# Patient Record
Sex: Male | Born: 1970 | Race: Black or African American | Hispanic: No | Marital: Married | Smoking: Former smoker
Health system: Southern US, Community
[De-identification: ages and names within clinical notes are randomized; demographics above are authoritative.]

## PROBLEM LIST (undated history)

## (undated) DIAGNOSIS — E119 Type 2 diabetes mellitus without complications: Secondary | ICD-10-CM

## (undated) DIAGNOSIS — K219 Gastro-esophageal reflux disease without esophagitis: Secondary | ICD-10-CM

## (undated) DIAGNOSIS — F319 Bipolar disorder, unspecified: Secondary | ICD-10-CM

## (undated) DIAGNOSIS — K7460 Unspecified cirrhosis of liver: Secondary | ICD-10-CM

## (undated) DIAGNOSIS — E7800 Pure hypercholesterolemia, unspecified: Secondary | ICD-10-CM

## (undated) DIAGNOSIS — J45909 Unspecified asthma, uncomplicated: Secondary | ICD-10-CM

---

## 2005-02-17 ENCOUNTER — Emergency Department (HOSPITAL_COMMUNITY): Admission: EM | Admit: 2005-02-17 | Discharge: 2005-02-18 | Payer: Self-pay | Admitting: Emergency Medicine

## 2007-06-17 ENCOUNTER — Encounter: Admission: RE | Admit: 2007-06-17 | Discharge: 2007-06-17 | Payer: Self-pay | Admitting: Gastroenterology

## 2010-06-08 ENCOUNTER — Encounter: Payer: Self-pay | Admitting: Gastroenterology

## 2014-02-17 ENCOUNTER — Encounter (HOSPITAL_COMMUNITY): Payer: Self-pay | Admitting: Emergency Medicine

## 2014-02-17 ENCOUNTER — Emergency Department (INDEPENDENT_AMBULATORY_CARE_PROVIDER_SITE_OTHER)
Admission: EM | Admit: 2014-02-17 | Discharge: 2014-02-17 | Disposition: A | Discharge status: Home / Self Care | Payer: Self-pay | Source: Home / Self Care | Attending: Family Medicine | Admitting: Family Medicine

## 2014-02-17 DIAGNOSIS — J300 Vasomotor rhinitis: Secondary | ICD-10-CM

## 2014-02-17 DIAGNOSIS — R739 Hyperglycemia, unspecified: Secondary | ICD-10-CM

## 2014-02-17 HISTORY — DX: Type 2 diabetes mellitus without complications: E11.9

## 2014-02-17 HISTORY — DX: Gastro-esophageal reflux disease without esophagitis: K21.9

## 2014-02-17 LAB — POCT I-STAT, CHEM 8
BUN: 13 mg/dL (ref 6–23)
CALCIUM ION: 1.14 mmol/L (ref 1.12–1.23)
CHLORIDE: 106 meq/L (ref 96–112)
CREATININE: 1.2 mg/dL (ref 0.50–1.35)
GLUCOSE: 125 mg/dL — AB (ref 70–99)
HCT: 52 % (ref 39.0–52.0)
Hemoglobin: 17.7 g/dL — ABNORMAL HIGH (ref 13.0–17.0)
POTASSIUM: 4.3 meq/L (ref 3.7–5.3)
Sodium: 141 mEq/L (ref 137–147)
TCO2: 23 mmol/L (ref 0–100)

## 2014-02-17 MED ORDER — IPRATROPIUM BROMIDE 0.06 % NA SOLN: 2.0000 | Freq: Four times a day (QID) | NASAL | Status: DC

## 2014-02-17 NOTE — ED Provider Notes (Signed)
Eddie Lane is a 43 y.o. male who presents to Urgent Care today for fatigue. Patient has felt ill for the last day or so. He's a little fatigue yesterday evening and noted his blood sugar was higher than normal. He measured it in the 270s. He did not go to work because he felt poorly and now needs a work note. He's feeling a bit better today and notes that his blood sugar is improved but not back to normal in the 170s. No chest pain palpitations or shortness of breath. No cough congestion or runny nose. He has not tried any medications yet.  He works in a [employer] and notes that he typically gets a runny nose at work.  Past Medical History  Diagnosis Date  . Diabetes mellitus without complication   . GERD (gastroesophageal reflux disease)    History  Substance Use Topics  . Smoking status: Current Every Day Smoker  . Smokeless tobacco: Not on file  . Alcohol Use: Yes     Comment: occasional   ROS as above Medications: No current facility-administered medications for this encounter.   Current Outpatient Prescriptions  Medication Sig Dispense Refill  . Acetaminophen (TYLENOL PO) Take by mouth.      Marland Kitchen. aspirin 81 MG tablet Take 81 mg by mouth daily.      . CYCLOBENZAPRINE HCL PO Take by mouth as needed.      . metFORMIN (GLUCOPHAGE) 500 MG tablet Take by mouth 2 (two) times daily with a meal.      . OMEPRAZOLE PO Take by mouth.      Marland Kitchen. ipratropium (ATROVENT) 0.06 % nasal spray Place 2 sprays into both nostrils 4 (four) times daily.  15 mL  12    Exam:  BP 133/86  Pulse 86  Temp(Src) 99.3 F (37.4 C) (Oral)  Resp 18  SpO2 96% Gen: Well NAD HEENT: EOMI,  MMM normal posterior pharynx. Normal tympanic membranes. Lungs: Normal work of breathing. CTABL Heart: RRR no MRG Abd: NABS, Soft. Nondistended, Nontender Exts: Brisk capillary refill, warm and well perfused.   Results for orders placed during the hospital encounter of 02/17/14 (from the past 24 hour(s))  POCT I-STAT,  CHEM 8     Status: Abnormal   Collection Time    02/17/14  1:26 PM      Result Value Ref Range   Sodium 141  137 - 147 mEq/L   Potassium 4.3  3.7 - 5.3 mEq/L   Chloride 106  96 - 112 mEq/L   BUN 13  6 - 23 mg/dL   Creatinine, Ser 8.111.20  0.50 - 1.35 mg/dL   Glucose, Bld 914125 (*) 70 - 99 mg/dL   Calcium, Ion 7.821.14  9.561.12 - 1.23 mmol/L   TCO2 23  0 - 100 mmol/L   Hemoglobin 17.7 (*) 13.0 - 17.0 g/dL   HCT 21.352.0  08.639.0 - 57.852.0 %   No results found.  Assessment and Plan: 43 y.o. male with fatigue. Possibly related to earlier hyperglycemia. Blood sugar well controlled currently. Plan for one full waiting and followup with PCP as needed. Work note provided.  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 Plan to use Atrovent nasal spray.  Discussed warning signs or symptoms. Please see discharge instructions. Patient expresses understanding.     Rodolph BongEvan S Corey, MD 02/17/14 [phone]

## 2014-02-17 NOTE — ED Notes (Addendum)
C/O weakness since yesterday.  Here because he needs a note excusing him from work yesterday.  Yesterday CBG = 270, this morning CBG = 167.  Has been eating as usual.  Denies any illnesses/cold sxs.  Denies pain.  Continues feeling weak.

## 2014-02-17 NOTE — Discharge Instructions (Signed)
Thank you for coming in today. Continue medications as directed.  Take Tylenol as needed for pain.  Go back to work when feeling better.  Use Atrovent nasal spray to prevent runny nose in cold rooms.   Blood Glucose Monitoring Monitoring your blood glucose (also know as blood sugar) helps you to manage your diabetes. It also helps you and your health care provider monitor your diabetes and determine how well your treatment plan is working. WHY SHOULD YOU MONITOR YOUR BLOOD GLUCOSE?  It can help you understand how food, exercise, and medicine affect your blood glucose.  It allows you to know what your blood glucose is at any given moment. You can quickly tell if you are having low blood glucose (hypoglycemia) or high blood glucose (hyperglycemia).  It can help you and your health care provider know how to adjust your medicines.  It can help you understand how to manage an illness or adjust medicine for exercise. WHEN SHOULD YOU TEST? Your health care provider will help you decide how often you should check your blood glucose. This may depend on the type of diabetes you have, your diabetes control, or the types of medicines you are taking. Be sure to write down all of your blood glucose readings so that this information can be reviewed with your health care provider. See below for examples of testing times that your health care provider may suggest. Type 1 Diabetes  Test 4 times a day if you are in good control, using an insulin pump, or perform multiple daily injections.  If your diabetes is not well controlled or if you are sick, you may need to monitor more often.  It is a good idea to also monitor:  Before and after exercise.  Between meals and 2 hours after a meal.  Occasionally between 2:00 a.m. and 3:00 a.m. Type 2 Diabetes  It can vary with each person, but generally, if you are on insulin, test 4 times a day.  If you take medicines by mouth (orally), test 2 times a  day.  If you are on a controlled diet, test once a day.  If your diabetes is not well controlled or if you are sick, you may need to monitor more often. HOW TO MONITOR YOUR BLOOD GLUCOSE Supplies Needed  Blood glucose meter.  Test strips for your meter. Each meter has its own strips. You must use the strips that go with your own meter.  A pricking needle (lancet).  A device that holds the lancet (lancing device).  A journal or log book to write down your results. Procedure  Wash your hands with soap and water. Alcohol is not preferred.  Prick the side of your finger (not the tip) with the lancet.  Gently milk the finger until a small drop of blood appears.  Follow the instructions that come with your meter for inserting the test strip, applying blood to the strip, and using your blood glucose meter. Other Areas to Get Blood for Testing Some meters allow you to use other areas of your body (other than your finger) to test your blood. These areas are called alternative sites. The most common alternative sites are:  The forearm.  The thigh.  The back area of the lower leg.  The palm of the hand. The blood flow in these areas is slower. Therefore, the blood glucose values you get may be delayed, and the numbers are different from what you would get from your fingers. Do not use  alternative sites if you think you are having hypoglycemia. Your reading will not be accurate. Always use a finger if you are having hypoglycemia. Also, if you cannot feel your lows (hypoglycemia unawareness), always use your fingers for your blood glucose checks. ADDITIONAL TIPS FOR GLUCOSE MONITORING  Do not reuse lancets.  Always carry your supplies with you.  All blood glucose meters have a 24-hour "hotline" number to call if you have questions or need help.  Adjust (calibrate) your blood glucose meter with a control solution after finishing a few boxes of strips. BLOOD GLUCOSE RECORD KEEPING It  is a good idea to keep a daily record or log of your blood glucose readings. Most glucose meters, if not all, keep your glucose records stored in the meter. Some meters come with the ability to download your records to your home computer. Keeping a record of your blood glucose readings is especially helpful if you are wanting to look for patterns. Make notes to go along with the blood glucose readings because you might forget what happened at that exact time. Keeping good records helps you and your health care provider to work together to achieve good diabetes management.  Document Released: 05/07/2003 Document Revised: 09/18/2013 Document Reviewed: 09/26/2012 [facility]: Eddie Lane Patient Information 2015 [place], [place]. This information is not intended to replace advice given to you by your health care provider. Make sure you discuss any questions you have with your health care provider.

## 2014-03-30 ENCOUNTER — Encounter (HOSPITAL_COMMUNITY): Payer: Self-pay | Admitting: Emergency Medicine

## 2014-03-30 ENCOUNTER — Emergency Department (HOSPITAL_COMMUNITY)
Admission: EM | Admit: 2014-03-30 | Discharge: 2014-03-30 | Disposition: A | Discharge status: Home / Self Care | Payer: Self-pay | Attending: Emergency Medicine | Admitting: Emergency Medicine

## 2014-03-30 DIAGNOSIS — Z79899 Other long term (current) drug therapy: Secondary | ICD-10-CM | POA: Insufficient documentation

## 2014-03-30 DIAGNOSIS — Z720 Tobacco use: Secondary | ICD-10-CM | POA: Insufficient documentation

## 2014-03-30 DIAGNOSIS — R0981 Nasal congestion: Secondary | ICD-10-CM | POA: Insufficient documentation

## 2014-03-30 DIAGNOSIS — R200 Anesthesia of skin: Secondary | ICD-10-CM | POA: Insufficient documentation

## 2014-03-30 DIAGNOSIS — J45909 Unspecified asthma, uncomplicated: Secondary | ICD-10-CM | POA: Insufficient documentation

## 2014-03-30 DIAGNOSIS — Z7951 Long term (current) use of inhaled steroids: Secondary | ICD-10-CM | POA: Insufficient documentation

## 2014-03-30 DIAGNOSIS — H9313 Tinnitus, bilateral: Secondary | ICD-10-CM | POA: Insufficient documentation

## 2014-03-30 DIAGNOSIS — E119 Type 2 diabetes mellitus without complications: Secondary | ICD-10-CM | POA: Insufficient documentation

## 2014-03-30 DIAGNOSIS — Z8719 Personal history of other diseases of the digestive system: Secondary | ICD-10-CM | POA: Insufficient documentation

## 2014-03-30 HISTORY — DX: Unspecified asthma, uncomplicated: J45.909

## 2014-03-30 LAB — I-STAT CHEM 8, ED
BUN: 16 mg/dL (ref 6–23)
CALCIUM ION: 1.25 mmol/L — AB (ref 1.12–1.23)
CHLORIDE: 105 meq/L (ref 96–112)
CREATININE: 1.2 mg/dL (ref 0.50–1.35)
GLUCOSE: 98 mg/dL (ref 70–99)
HCT: 47 % (ref 39.0–52.0)
Hemoglobin: 16 g/dL (ref 13.0–17.0)
Potassium: 4.3 mEq/L (ref 3.7–5.3)
Sodium: 141 mEq/L (ref 137–147)
TCO2: 24 mmol/L (ref 0–100)

## 2014-03-30 MED ORDER — FLUTICASONE PROPIONATE 50 MCG/ACT NA SUSP: 2.0000 | Freq: Every day | NASAL | Status: DC

## 2014-03-30 NOTE — Discharge Instructions (Signed)
Use flonase as directed. You may also use nasal saline. If your symptoms of numbness return, you should return to the emergency department. Paresthesia Paresthesia is an abnormal burning or prickling sensation. This sensation is generally felt in the hands, arms, legs, or feet. However, it may occur in any part of the body. It is usually not painful. The feeling may be described as:  Tingling or numbness.  "Pins and needles."  Skin crawling.  Buzzing.  Limbs "falling asleep."  Itching. Most people experience temporary (transient) paresthesia at some time in their lives. CAUSES  Paresthesia may occur when you breathe too quickly (hyperventilation). It can also occur without any apparent cause. Commonly, paresthesia occurs when pressure is placed on a nerve. The feeling quickly goes away once the pressure is removed. For some people, however, paresthesia is a long-lasting (chronic) condition caused by an underlying disorder. The underlying disorder may be:  A traumatic, direct injury to nerves. Examples include a:  Broken (fractured) neck.  Fractured skull.  A disorder affecting the brain and spinal cord (central nervous system). Examples include:  Transverse myelitis.  Encephalitis.  Transient ischemic attack.  Multiple sclerosis.  Stroke.  Tumor or blood vessel problems, such as an arteriovenous malformation pressing against the brain or spinal cord.  A condition that damages the peripheral nerves (peripheral neuropathy). Peripheral nerves are not part of the brain and spinal cord. These conditions include:  Diabetes.  Peripheral vascular disease.  Nerve entrapment syndromes, such as carpal tunnel syndrome.  Shingles.  Hypothyroidism.  Vitamin B12 deficiencies.  Alcoholism.  Heavy metal poisoning (lead, arsenic).  Rheumatoid arthritis.  Systemic lupus erythematosus. DIAGNOSIS  Your caregiver will attempt to find the underlying cause of your paresthesia.  Your caregiver may:  Take your medical history.  Perform a physical exam.  Order various lab tests.  Order imaging tests. TREATMENT  Treatment for paresthesia depends on the underlying cause. HOME CARE INSTRUCTIONS  Avoid drinking alcohol.  You may consider massage or acupuncture to help relieve your symptoms.  Keep all follow-up appointments as directed by your caregiver. SEEK IMMEDIATE MEDICAL CARE IF:   You feel weak.  You have trouble walking or moving.  You have problems with speech or vision.  You feel confused.  You cannot control your bladder or bowel movements.  You feel numbness after an injury.  You faint.  Your burning or prickling feeling gets worse when walking.  You have pain, cramps, or dizziness.  You develop a rash. MAKE SURE YOU:  Understand these instructions.  Will watch your condition.  Will get help right away if you are not doing well or get worse. Document Released: 04/24/2002 Document Revised: 07/27/2011 Document Reviewed: 01/23/2011 [facility] Patient Information 2015 [place], [place]. This information is not intended to replace advice given to you by your health care provider. Make sure you discuss any questions you have with your health care provider. Tinnitus Sounds you hear in your ears and coming from within the ear is called tinnitus. This can be a symptom of many ear disorders. It is often associated with hearing loss.  Tinnitus can be seen with:  Infections.  Ear blockages such as wax buildup.  Meniere's disease.  Ear damage.  Inherited.  Occupational causes. While irritating, it is not usually a threat to health. When the cause of the tinnitus is wax, infection in the middle ear, or foreign body it is easily treated. Hearing loss will usually be reversible.  TREATMENT  When treating the underlying cause does  not get rid of tinnitus, it may be necessary to get rid of the unwanted sound by covering it up with more  pleasant background noises. This may include music, the radio etc. There are tinnitus maskers which can be worn which produce background noise to cover up the tinnitus. Avoid all medications which tend to make tinnitus worse such as alcohol, caffeine, aspirin, and nicotine. There are many soothing background tapes such as rain, ocean, thunderstorms, etc. These soothing sounds help with sleeping or resting. Keep all follow-up appointments and referrals. This is important to identify the cause of the problem. It also helps avoid complications, impaired hearing, disability, or chronic pain. Document Released: 05/04/2005 Document Revised: 07/27/2011 Document Reviewed: 12/21/2007 [facility] Patient Information 2015 [place], [place]. This information is not intended to replace advice given to you by your health care provider. Make sure you discuss any questions you have with your health care provider. Allergic Rhinitis Allergic rhinitis is when the mucous membranes in the nose respond to allergens. Allergens are particles in the air that cause your body to have an allergic reaction. This causes you to release allergic antibodies. Through a chain of events, these eventually cause you to release histamine into the blood stream. Although meant to protect the body, it is this release of histamine that causes your discomfort, such as frequent sneezing, congestion, and an itchy, runny nose.  CAUSES  Seasonal allergic rhinitis (hay fever) is caused by pollen allergens that may come from grasses, trees, and weeds. Year-round allergic rhinitis (perennial allergic rhinitis) is caused by allergens such as house dust mites, pet dander, and mold spores.  SYMPTOMS   Nasal stuffiness (congestion).  Itchy, runny nose with sneezing and tearing of the eyes. DIAGNOSIS  Your health care provider can help you determine the allergen or allergens that trigger your symptoms. If you and your health care provider are unable to  determine the allergen, skin or blood testing may be used. TREATMENT  Allergic rhinitis does not have a cure, but it can be controlled by:  Medicines and allergy shots (immunotherapy).  Avoiding the allergen. Hay fever may often be treated with antihistamines in pill or nasal spray forms. Antihistamines block the effects of histamine. There are over-the-counter medicines that may help with nasal congestion and swelling around the eyes. Check with your health care provider before taking or giving this medicine.  If avoiding the allergen or the medicine prescribed do not work, there are many new medicines your health care provider can prescribe. Stronger medicine may be used if initial measures are ineffective. Desensitizing injections can be used if medicine and avoidance does not work. Desensitization is when a patient is given ongoing shots until the body becomes less sensitive to the allergen. Make sure you follow up with your health care provider if problems continue. HOME CARE INSTRUCTIONS It is not possible to completely avoid allergens, but you can reduce your symptoms by taking steps to limit your exposure to them. It helps to know exactly what you are allergic to so that you can avoid your specific triggers. SEEK MEDICAL CARE IF:   You have a fever.  You develop a cough that does not stop easily (persistent).  You have shortness of breath.  You start wheezing.  Symptoms interfere with normal daily activities. Document Released: 01/27/2001 Document Revised: 05/09/2013 Document Reviewed: 01/09/2013 [facility] Patient Information 2015 [place], [place]. This information is not intended to replace advice given to you by your health care provider. Make sure you discuss any  questions you have with your health care provider. ° °

## 2014-03-30 NOTE — ED Notes (Signed)
Pt. reports facial numbness , blurred vision , " ringing at ears" last week - symptoms resolved , speech clear , no facial asymmetry , alert and oriented , no arm drift/equal strong grips , " I just want to be checked" . Denies pain / respirations unlabored.

## 2014-03-30 NOTE — ED Provider Notes (Signed)
CSN: [id number]     Arrival date & time 03/30/14  1949 History   First MD Initiated Contact with Patient 03/30/14 2230     Chief Complaint  Patient presents with  . Numbness     (Consider location/radiation/quality/duration/timing/severity/associated sxs/prior Treatment) HPI Comments: This is a 43 year old male with a past medical history of diabetes, GERD and asthma who presents to the emergency department complaining of facial numbness, blurred vision and ringing in both of his ears occurring one week ago lasting about 15-20 minutes and completely subsiding on its own. Patient was not doing anything in specific when the symptoms began. States the numbness was throughout the entire front of his face. His mother states he was shaking his head to try to get the ring to go away. Over the past week his symptoms have not returned. He states he has been feeling fine. Denies ever having symptoms like this in the past. He states he feels like he has fluid behind his sinuses. Denies fever, chills, nausea, vomiting, chest pain, shortness of breath, headaches or extremity weakness. He did not call his primary care physician about his symptoms.  The history is provided by the patient and a relative.    Past Medical History  Diagnosis Date  . Diabetes mellitus without complication   . GERD (gastroesophageal reflux disease)   . Asthma    History reviewed. No pertinent past surgical history. No family history on file. History  Substance Use Topics  . Smoking status: Current Every Day Smoker  . Smokeless tobacco: Not on file  . Alcohol Use: Yes     Comment: occasional    Review of Systems  10 Systems reviewed and are negative for acute change except as noted in the HPI.  Allergies  Review of patient's allergies indicates no known allergies.  Home Medications   Prior to Admission medications   Medication Sig Start Date End Date Taking? Authorizing Provider  acetaminophen (TYLENOL) 500 MG  tablet Take 1,000 mg by mouth 2 (two) times daily.   Yes Historical Provider, MD  aspirin 81 MG tablet Take 81 mg by mouth daily.   Yes Historical Provider, MD  ipratropium (ATROVENT) 0.06 % nasal spray Place 2 sprays into both nostrils 4 (four) times daily. 02/17/14  Yes Rodolph BongEvan S Corey, MD  metFORMIN (GLUCOPHAGE) 500 MG tablet Take 500 mg by mouth 2 (two) times daily with a meal.    Yes Historical Provider, MD  fluticasone (FLONASE) 50 MCG/ACT nasal spray Place 2 sprays into both nostrils daily. 03/30/14   Ashwini Jago M Melanye Hiraldo, PA-C   BP 125/75 mmHg  Pulse 74  Temp(Src) 97.6 F (36.4 C) (Oral)  Resp 16  SpO2 98% Physical Exam  Constitutional: He is oriented to person, place, and time. He appears well-developed and well-nourished. No distress.  HENT:  Head: Normocephalic and atraumatic.  Nose: Mucosal edema present.  Mouth/Throat: Oropharynx is clear and moist.  Tympanostomy tubes bilateral TM.  Eyes: Conjunctivae and EOM are normal. Pupils are equal, round, and reactive to light.  Neck: Normal range of motion. Neck supple. No JVD present. No spinous process tenderness and no muscular tenderness present.  No meningeal signs.  Cardiovascular: Normal rate, regular rhythm, normal heart sounds and intact distal pulses.   No extremity edema.  Pulmonary/Chest: Effort normal and breath sounds normal. No respiratory distress.  Abdominal: Soft. Bowel sounds are normal. There is no tenderness.  Musculoskeletal: Normal range of motion. He exhibits no edema.  Neurological: He is alert and oriented  to person, place, and time. He has normal strength. No cranial nerve deficit or sensory deficit. He displays a negative Romberg sign. Coordination and gait normal. GCS eye subscore is 4. GCS verbal subscore is 5. GCS motor subscore is 6.  Speech fluent and goal oriented. Moves limbs without ataxia. Equal grip strength bilaterally.  Skin: Skin is warm and dry. No rash noted. He is not diaphoretic.  Psychiatric: He  has a normal mood and affect. His behavior is normal.  Nursing note and vitals reviewed.   ED Course  Procedures (including critical care time) Labs Review Labs Reviewed  I-STAT CHEM 8, ED - Abnormal; Notable for the following:    Calcium, Ion 1.25 (*)    All other components within normal limits    Imaging Review No results found.   EKG Interpretation None      MDM   Final diagnoses:  Sinus congestion  Facial numbness  Tinnitus, bilateral   Patient presenting after an episode of facial numbness, tinnitus occurring one week ago, lasting 15-20 minutes as stated above. No further symptoms other than a fluid feeling behind his sinuses. He is well-appearing and in no apparent distress. Afebrile, vital signs stable. No focal neurologic deficits. Facial numbness was bilateral, along with the tinnitus. Doubt TIA. I advised patient that if he ever has symptoms like this in the future, he should immediately come to the emergency department for evaluation. Chem-8 within normal limits. He is stable for discharge with PCP follow-up. Return precautions given. Patient states understanding of treatment care plan and is agreeable.  Discussed with attending Dr. Criss AlvineGoldston who agrees with plan of care.     Kathrynn SpeedRobyn M Laurence Folz, PA-C 03/30/14 [id number]  Audree CamelScott T Goldston, MD 03/31/14 [phone]

## 2014-04-23 ENCOUNTER — Emergency Department (INDEPENDENT_AMBULATORY_CARE_PROVIDER_SITE_OTHER)
Admission: EM | Admit: 2014-04-23 | Discharge: 2014-04-23 | Disposition: A | Discharge status: Home / Self Care | Payer: Self-pay | Source: Home / Self Care | Attending: Family Medicine | Admitting: Family Medicine

## 2014-04-23 ENCOUNTER — Encounter (HOSPITAL_COMMUNITY): Payer: Self-pay | Admitting: *Deleted

## 2014-04-23 DIAGNOSIS — A084 Viral intestinal infection, unspecified: Secondary | ICD-10-CM

## 2014-04-23 NOTE — Discharge Instructions (Signed)
Thank you for coming in today. Use over the counter imodium as needed. Do not use if you have severe abdominal pain or blood in the stool.  If your belly pain worsens, or you have high fever, bad vomiting, blood in your stool or black tarry stool go to the Emergency Room.   Diarrhea Diarrhea is frequent loose and watery bowel movements. It can cause you to feel weak and dehydrated. Dehydration can cause you to become tired and thirsty, have a dry mouth, and have decreased urination that often is dark yellow. Diarrhea is a sign of another problem, most often an infection that will not last long. In most cases, diarrhea typically lasts 2-3 days. However, it can last longer if it is a sign of something more serious. It is important to treat your diarrhea as directed by your caregiver to lessen or prevent future episodes of diarrhea. CAUSES  Some common causes include:  Gastrointestinal infections caused by viruses, bacteria, or parasites.  Food poisoning or food allergies.  Certain medicines, such as antibiotics, chemotherapy, and laxatives.  Artificial sweeteners and fructose.  Digestive disorders. HOME CARE INSTRUCTIONS  Ensure adequate fluid intake (hydration): Have 1 cup (8 oz) of fluid for each diarrhea episode. Avoid fluids that contain simple sugars or sports drinks, fruit juices, whole milk products, and sodas. Your urine should be clear or pale yellow if you are drinking enough fluids. Hydrate with an oral rehydration solution that you can purchase at pharmacies, retail stores, and online. You can prepare an oral rehydration solution at home by mixing the following ingredients together:   - tsp table salt.   tsp baking soda.   tsp salt substitute containing potassium chloride.  1  tablespoons sugar.  1 L (34 oz) of water.  Certain foods and beverages may increase the speed at which food moves through the gastrointestinal (GI) tract. These foods and beverages should be avoided  and include:  Caffeinated and alcoholic beverages.  High-fiber foods, such as raw fruits and vegetables, nuts, seeds, and whole grain breads and cereals.  Foods and beverages sweetened with sugar alcohols, such as xylitol, sorbitol, and mannitol.  Some foods may be well tolerated and may help thicken stool including:  Starchy foods, such as rice, toast, pasta, low-sugar cereal, oatmeal, grits, baked potatoes, crackers, and bagels.  Bananas.  Applesauce.  Add probiotic-rich foods to help increase healthy bacteria in the GI tract, such as yogurt and fermented milk products.  Wash your hands well after each diarrhea episode.  Only take over-the-counter or prescription medicines as directed by your caregiver.  Take a warm bath to relieve any burning or pain from frequent diarrhea episodes. SEEK IMMEDIATE MEDICAL CARE IF:   You are unable to keep fluids down.  You have persistent vomiting.  You have blood in your stool, or your stools are black and tarry.  You do not urinate in 6-8 hours, or there is only a small amount of very dark urine.  You have abdominal pain that increases or localizes.  You have weakness, dizziness, confusion, or light-headedness.  You have a severe headache.  Your diarrhea gets worse or does not get better.  You have a fever or persistent symptoms for more than 2-3 days.  You have a fever and your symptoms suddenly get worse. MAKE SURE YOU:   Understand these instructions.  Will watch your condition.  Will get help right away if you are not doing well or get worse. Document Released: 04/24/2002 Document Revised: 09/18/2013  Document Reviewed: 01/10/2012 [facility] Patient Information 2015 [place], [place]. This information is not intended to replace advice given to you by your health care provider. Make sure you discuss any questions you have with your health care provider.

## 2014-04-23 NOTE — ED Provider Notes (Signed)
Eddie Lane is a 43 y.o. male who presents to Urgent Care today for diarrhea. Patient has a one-day history of diarrhea. Diarrhea started yesterday and is currently improving. No pain or blood. No fevers or chills vomiting. Patient feels well otherwise. No sick contacts.   Past Medical History  Diagnosis Date  . Diabetes mellitus without complication   . GERD (gastroesophageal reflux disease)   . Asthma    No past surgical history on file. History  Substance Use Topics  . Smoking status: Current Every Day Smoker  . Smokeless tobacco: Not on file  . Alcohol Use: Yes     Comment: occasional   ROS as above Medications: No current facility-administered medications for this encounter.   Current Outpatient Prescriptions  Medication Sig Dispense Refill  . acetaminophen (TYLENOL) 500 MG tablet Take 1,000 mg by mouth 2 (two) times daily.    Marland Kitchen. aspirin 81 MG tablet Take 81 mg by mouth daily.    . cyclobenzaprine (FLEXERIL) 10 MG tablet Take 10 mg by mouth 3 (three) times daily as needed for muscle spasms.    . fluticasone (FLONASE) 50 MCG/ACT nasal spray Place 2 sprays into both nostrils daily. 16 g 0  . metFORMIN (GLUCOPHAGE) 500 MG tablet Take 500 mg by mouth 2 (two) times daily with a meal.     . albuterol (PROVENTIL HFA;VENTOLIN HFA) 108 (90 BASE) MCG/ACT inhaler Inhale 2 puffs into the lungs every 6 (six) hours as needed for wheezing or shortness of breath.    Marland Kitchen. ipratropium (ATROVENT) 0.06 % nasal spray Place 2 sprays into both nostrils 4 (four) times daily. 15 mL 12   No Known Allergies   Exam:  BP 133/95 mmHg  Pulse 84  Temp(Src) 98.2 F (36.8 C) (Oral)  Resp 18  SpO2 98% Gen: Well NAD morbidly obese HEENT: EOMI,  MMM Lungs: Normal work of breathing. CTABL Heart: RRR no MRG Abd: NABS, Soft. Nondistended, Nontender no rebound or guarding Exts: Brisk capillary refill, warm and well perfused.   No results found for this or any previous visit (from the past 24 hour(s)). No  results found.  Assessment and Plan: 43 y.o. male with diarrhea likely viral. Imodium. Watchful waiting. Work note provided. Follow-up as needed.  Discussed warning signs or symptoms. Please see discharge instructions. Patient expresses understanding.     Rodolph BongEvan S Murvin Gift, MD 04/23/14 [phone]

## 2014-04-23 NOTE — ED Notes (Signed)
C/o diarrhea onset last night.  No N or V.  No sick contacts.  No fever.  D x 4 last night and 4-5 times today.  He had a little blood in stool -hemorrhoid is irritated.

## 2014-05-29 ENCOUNTER — Emergency Department (INDEPENDENT_AMBULATORY_CARE_PROVIDER_SITE_OTHER)
Admission: EM | Admit: 2014-05-29 | Discharge: 2014-05-29 | Disposition: A | Discharge status: Home / Self Care | Payer: Self-pay | Source: Home / Self Care | Attending: Emergency Medicine | Admitting: Emergency Medicine

## 2014-05-29 ENCOUNTER — Encounter (HOSPITAL_COMMUNITY): Payer: Self-pay | Admitting: *Deleted

## 2014-05-29 DIAGNOSIS — J069 Acute upper respiratory infection, unspecified: Secondary | ICD-10-CM

## 2014-05-29 HISTORY — DX: Pure hypercholesterolemia, unspecified: E78.00

## 2014-05-29 MED ORDER — ALBUTEROL SULFATE HFA 108 (90 BASE) MCG/ACT IN AERS: 1.0000 | INHALATION_SPRAY | Freq: Four times a day (QID) | RESPIRATORY_TRACT | Status: DC | PRN

## 2014-05-29 MED ORDER — HYDROCOD POLST-CHLORPHEN POLST 10-8 MG/5ML PO LQCR: 5.0000 mL | Freq: Two times a day (BID) | ORAL | Status: DC | PRN

## 2014-05-29 MED ORDER — PREDNISONE 20 MG PO TABS: 20.0000 mg | ORAL_TABLET | Freq: Two times a day (BID) | ORAL | Status: DC

## 2014-05-29 NOTE — ED Notes (Signed)
C/o dry cough x 3-4 days and L ear was stopped.  Throat hurts when he coughs but did not have a sore throat.  Also c/o feeling week.  No fever.  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

## 2014-05-29 NOTE — Discharge Instructions (Signed)

## 2014-05-29 NOTE — ED Provider Notes (Signed)
Chief Complaint   Cough   History of Present Illness   Eddie Lane is a 44 year old male who's had a 3 to four-day history of nasal congestion, clear rhinorrhea, headache, and left ear congestion. He's also had dry cough, sore throat, and felt tired and rundown. He denies any wheezing, difficulty breathing, or chest pain. He does have a history of asthma and has an albuterol inhaler which she uses as needed. He denies any fever, chills, sweats, or GI symptoms. He's had no known sick exposures. 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  Review of Systems   Other than as noted above, the patient denies any of the following symptoms: Systemic:  No fevers, chills, sweats, or myalgias. Eye:  No redness or discharge. ENT:  No ear pain, headache, nasal congestion, drainage, sinus pressure, or sore throat. Neck:  No neck pain, stiffness, or swollen glands. Lungs:  No cough, sputum production, hemoptysis, wheezing, chest tightness, shortness of breath or chest pain. GI:  No abdominal pain, nausea, vomiting or diarrhea.  PMFSH   Past medical history, family history, social history, meds, and allergies were reviewed. He has diabetes and is on metformin. He also has hypercholesterolemia and is supposed be taking something for that, but he is not. He also has asthma and allergies. He uses albuterol, Atrovent, and Flonase.  Physical exam   Vital signs:  BP 114/72 mmHg  Pulse 104  Temp(Src) 97.7 F (36.5 C) (Oral)  Resp 18  SpO2 98% General:  Alert and oriented.  In no distress.  Skin warm and dry. Eye:  No conjunctival injection or drainage. Lids were normal. ENT:  He has ventilation tubes in both TMs. The left ventilation tube was occluded with cerumen, and there was no erythema or discharge in the canals.  Nasal mucosa was clear and uncongested, without drainage.  Mucous membranes were moist.  Pharynx was clear with no exudate or drainage.   There were no oral ulcerations or lesions. Neck:  Supple, no adenopathy, tenderness or mass. Lungs:  No respiratory distress.  Lungs were clear to auscultation, without wheezes, rales or rhonchi.  Breath sounds were clear and equal bilaterally.  Heart:  Regular rhythm, without gallops, murmers or rubs. Skin:  Clear, warm, and dry, without rash or lesions.  Assessment     The encounter diagnosis was Viral URI.  There is no evidence of pneumonia, strep throat, sinusitis, otitis media.    Plan    1.  Meds:  The following meds were prescribed:   New Prescriptions   ALBUTEROL (PROVENTIL HFA;VENTOLIN HFA) 108 (90 BASE) MCG/ACT INHALER    Inhale 1-2 puffs into the lungs every 6 (six) hours as needed for wheezing or shortness of breath.   CHLORPHENIRAMINE-HYDROCODONE (TUSSIONEX) 10-8 MG/5ML LQCR    Take 5 mLs by mouth every 12 (twelve) hours as needed for cough.   PREDNISONE (DELTASONE) 20 MG TABLET    Take 1 tablet (20 mg total) by mouth 2 (two) times daily.    2.  Patient Education/Counseling:  The patient was given appropriate handouts, self care instructions, and instructed in symptomatic relief.  Instructed to get extra fluids and extra rest.  He is to get the prescription for the prednisone filled if he has any trouble with his asthma.  3.  Follow up:  The patient was told to follow up here if no better in 3 to 4 days, or sooner if becoming  worse in any way, and given some red flag symptoms such as increasing fever, difficulty breathing, chest pain, or persistent vomiting which would prompt immediate return.       Reuben Likesavid C Jaleah Lefevre, MD 05/29/14 2033

## 2014-06-25 ENCOUNTER — Encounter (HOSPITAL_COMMUNITY): Payer: Self-pay | Admitting: Emergency Medicine

## 2014-06-25 ENCOUNTER — Emergency Department (INDEPENDENT_AMBULATORY_CARE_PROVIDER_SITE_OTHER)
Admission: EM | Admit: 2014-06-25 | Discharge: 2014-06-25 | Disposition: A | Discharge status: Home / Self Care | Payer: No Typology Code available for payment source | Source: Home / Self Care | Attending: Family Medicine | Admitting: Family Medicine

## 2014-06-25 DIAGNOSIS — R110 Nausea: Secondary | ICD-10-CM

## 2014-06-25 DIAGNOSIS — R05 Cough: Secondary | ICD-10-CM

## 2014-06-25 DIAGNOSIS — R059 Cough, unspecified: Secondary | ICD-10-CM

## 2014-06-25 DIAGNOSIS — R197 Diarrhea, unspecified: Secondary | ICD-10-CM

## 2014-06-25 MED ORDER — ONDANSETRON 8 MG PO TBDP: 8.0000 mg | ORAL_TABLET | Freq: Three times a day (TID) | ORAL | Status: DC | PRN

## 2014-06-25 NOTE — Discharge Instructions (Signed)
Diarrhea °Diarrhea is frequent loose and watery bowel movements. It can cause you to feel weak and dehydrated. Dehydration can cause you to become tired and thirsty, have a dry mouth, and have decreased urination that often is dark yellow. Diarrhea is a sign of another problem, most often an infection that will not last long. In most cases, diarrhea typically lasts 2-3 days. However, it can last longer if it is a sign of something more serious. It is important to treat your diarrhea as directed by your caregiver to lessen or prevent future episodes of diarrhea. °CAUSES  °Some common causes include: °· Gastrointestinal infections caused by viruses, bacteria, or parasites. °· Food poisoning or food allergies. °· Certain medicines, such as antibiotics, chemotherapy, and laxatives. °· Artificial sweeteners and fructose. °· Digestive disorders. °HOME CARE INSTRUCTIONS °· Ensure adequate fluid intake (hydration): Have 1 cup (8 oz) of fluid for each diarrhea episode. Avoid fluids that contain simple sugars or sports drinks, fruit juices, whole milk products, and sodas. Your urine should be clear or pale yellow if you are drinking enough fluids. Hydrate with an oral rehydration solution that you can purchase at pharmacies, retail stores, and online. You can prepare an oral rehydration solution at home by mixing the following ingredients together: °·  - tsp table salt. °· ¾ tsp baking soda. °·  tsp salt substitute containing potassium chloride. °· 1  tablespoons sugar. °· 1 L (34 oz) of water. °· Certain foods and beverages may increase the speed at which food moves through the gastrointestinal (GI) tract. These foods and beverages should be avoided and include: °· Caffeinated and alcoholic beverages. °· High-fiber foods, such as raw fruits and vegetables, nuts, seeds, and whole grain breads and cereals. °· Foods and beverages sweetened with sugar alcohols, such as xylitol, sorbitol, and mannitol. °· Some foods may be well  tolerated and may help thicken stool including: °· Starchy foods, such as rice, toast, pasta, low-sugar cereal, oatmeal, grits, baked potatoes, crackers, and bagels. °· Bananas. °· Applesauce. °· Add probiotic-rich foods to help increase healthy bacteria in the GI tract, such as yogurt and fermented milk products. °· Wash your hands well after each diarrhea episode. °· Only take over-the-counter or prescription medicines as directed by your caregiver. °· Take a warm bath to relieve any burning or pain from frequent diarrhea episodes. °SEEK IMMEDIATE MEDICAL CARE IF:  °· You are unable to keep fluids down. °· You have persistent vomiting. °· You have blood in your stool, or your stools are black and tarry. °· You do not urinate in 6-8 hours, or there is only a small amount of very dark urine. °· You have abdominal pain that increases or localizes. °· You have weakness, dizziness, confusion, or light-headedness. °· You have a severe headache. °· Your diarrhea gets worse or does not get better. °· You have a fever or persistent symptoms for more than 2-3 days. °· You have a fever and your symptoms suddenly get worse. °MAKE SURE YOU:  °· Understand these instructions. °· Will watch your condition. °· Will get help right away if you are not doing well or get worse. °Document Released: 04/24/2002 Document Revised: 09/18/2013 Document Reviewed: 01/10/2012 °[employer]® Patient Information ©2015 [employer]. This information is not intended to replace advice given to you by your health care provider. Make sure you discuss any questions you have with your health care provider. ° °Nausea, Adult °Nausea is the feeling that you have an upset stomach or have to vomit.   Nausea by itself is not likely a serious concern, but it may be an early sign of more serious medical problems. As nausea gets worse, it can lead to vomiting. If vomiting develops, there is the risk of dehydration.  CAUSES   Viral infections.  Food  poisoning.  Medicines.  Pregnancy.  Motion sickness.  Migraine headaches.  Emotional distress.  Severe pain from any source.  Alcohol intoxication. HOME CARE INSTRUCTIONS  Get plenty of rest.  Ask your caregiver about specific rehydration instructions.  Eat small amounts of food and sip liquids more often.  Take all medicines as told by your caregiver. SEEK MEDICAL CARE IF:  You have not improved after 2 days, or you get worse.  You have a headache. SEEK IMMEDIATE MEDICAL CARE IF:   You have a fever.  You faint.  You keep vomiting or have blood in your vomit.  You are extremely weak or dehydrated.  You have dark or bloody stools.  You have severe chest or abdominal pain. MAKE SURE YOU:  Understand these instructions.  Will watch your condition.  Will get help right away if you are not doing well or get worse. Document Released: 06/11/2004 Document Revised: 01/27/2012 Document Reviewed: 01/14/2011 [facility] Patient Information 2015 [employer] [place]. This information is not intended to replace advice given to you by your health care provider. Make sure you discuss any questions you have with your health care provider.

## 2014-06-25 NOTE — ED Provider Notes (Signed)
CSN: [id number]     Arrival date & time 06/25/14  1643 History   First MD Initiated Contact with Patient 06/25/14 1828     No chief complaint on file.  (Consider location/radiation/quality/duration/timing/severity/associated sxs/prior Treatment) HPI         44 year old male presents complaining of nausea and diarrhea. He thinks he ate some bad chicken wings and that has upset his stomach. He is here requesting a work note because he does not feel well enough to go to work today. No abdominal pain. Also has a slight cough, no chest pain or shortness of breath. No abdominal pain. Diarrhea is nonbloody. Nausea but no vomiting. He is taking Pepto-Bismol as needed with relief of his symptoms  Past Medical History  Diagnosis Date  . Diabetes mellitus without complication   . GERD (gastroesophageal reflux disease)   . Asthma   . High cholesterol    No past surgical history on file. Family History  Problem Relation Age of Onset  . Hypertension Mother   . Diabetes Mother    History  Substance Use Topics  . Smoking status: Current Every Day Smoker -- 0.25 packs/day    Types: Cigarettes  . Smokeless tobacco: Not on file  . Alcohol Use: Yes     Comment: occasional    Review of Systems  Respiratory: Positive for cough. Negative for shortness of breath.   Cardiovascular: Negative for chest pain.  Gastrointestinal: Positive for nausea and diarrhea. Negative for vomiting and abdominal pain.  All other systems reviewed and are negative.   Allergies  Review of patient's allergies indicates no known allergies.  Home Medications   Prior to Admission medications   Medication Sig Start Date End Date Taking? Authorizing Provider  acetaminophen (TYLENOL) 500 MG tablet Take 1,000 mg by mouth 2 (two) times daily.    Historical Provider, MD  albuterol (PROVENTIL HFA;VENTOLIN HFA) 108 (90 BASE) MCG/ACT inhaler Inhale 2 puffs into the lungs every 6 (six) hours as needed for wheezing or shortness of  breath.    Historical Provider, MD  albuterol (PROVENTIL HFA;VENTOLIN HFA) 108 (90 BASE) MCG/ACT inhaler Inhale 1-2 puffs into the lungs every 6 (six) hours as needed for wheezing or shortness of breath. 05/29/14   Reuben Likesavid C Keller, MD  aspirin 81 MG tablet Take 81 mg by mouth daily.    Historical Provider, MD  chlorpheniramine-HYDROcodone (TUSSIONEX) 10-8 MG/5ML LQCR Take 5 mLs by mouth every 12 (twelve) hours as needed for cough. 05/29/14   Reuben Likesavid C Keller, MD  cyclobenzaprine (FLEXERIL) 10 MG tablet Take 10 mg by mouth 3 (three) times daily as needed for muscle spasms.    Historical Provider, MD  fluticasone (FLONASE) 50 MCG/ACT nasal spray Place 2 sprays into both nostrils daily. 03/30/14   Robyn M Hess, PA-C  ipratropium (ATROVENT) 0.06 % nasal spray Place 2 sprays into both nostrils 4 (four) times daily. 02/17/14   Rodolph BongEvan S Corey, MD  metFORMIN (GLUCOPHAGE) 500 MG tablet Take 500 mg by mouth daily with breakfast.     Historical Provider, MD  ondansetron (ZOFRAN ODT) 8 MG disintegrating tablet Take 1 tablet (8 mg total) by mouth every 8 (eight) hours as needed for nausea or vomiting. 06/25/14   Graylon GoodZachary H Jeoffrey Eleazer, PA-C  predniSONE (DELTASONE) 20 MG tablet Take 1 tablet (20 mg total) by mouth 2 (two) times daily. 05/29/14   Reuben Likesavid C Keller, MD   BP 122/78 mmHg  Pulse 92  Temp(Src) 97.2 F (36.2 C) (Oral)  Resp 20  SpO2 97% Physical Exam  Constitutional: He is oriented to person, place, and time. He appears well-developed and well-nourished. No distress.  Obese Habitus  HENT:  Head: Normocephalic.  Cardiovascular: Normal rate, regular rhythm and normal heart sounds.   Pulmonary/Chest: Effort normal and breath sounds normal. No respiratory distress. He has no wheezes. He has no rales.  Abdominal: Soft. There is tenderness. There is positive Murphy's sign (equivocally positive). There is no rebound and no guarding.  Neurological: He is alert and oriented to person, place, and time. Coordination normal.   Skin: Skin is warm and dry. No rash noted. He is not diaphoretic.  Psychiatric: He has a normal mood and affect. Judgment normal.  Nursing note and vitals reviewed.   ED Course  Procedures (including critical care time) Labs Review Labs Reviewed - No data to display  Imaging Review No results found.   MDM   1. Nausea   2. Diarrhea   3. Cough    Probable enteritis, however he was given strict return precautions given the equivocally positive Murphy's sign. If his abdominal pain, nausea give worse or if he develops a fever, he will go to the emergency department for further evaluation. Clear liquid diet, Zofran, Imodium for now.   Meds ordered this encounter  Medications  . ondansetron (ZOFRAN ODT) 8 MG disintegrating tablet    Sig: Take 1 tablet (8 mg total) by mouth every 8 (eight) hours as needed for nausea or vomiting.    Dispense:  12 tablet    Refill:  0       Graylon Good, PA-C 06/25/14 [phone]

## 2014-06-29 ENCOUNTER — Encounter (HOSPITAL_COMMUNITY): Payer: Self-pay | Admitting: Emergency Medicine

## 2014-06-29 ENCOUNTER — Emergency Department (INDEPENDENT_AMBULATORY_CARE_PROVIDER_SITE_OTHER)
Admission: EM | Admit: 2014-06-29 | Discharge: 2014-06-29 | Disposition: A | Discharge status: Home / Self Care | Payer: No Typology Code available for payment source | Source: Home / Self Care

## 2014-06-29 DIAGNOSIS — A084 Viral intestinal infection, unspecified: Secondary | ICD-10-CM

## 2014-06-29 NOTE — ED Provider Notes (Signed)
CSN: [id number]     Arrival date & time 06/29/14  1943 History   None    Chief Complaint  Patient presents with  . Diarrhea   (Consider location/radiation/quality/duration/timing/severity/associated sxs/prior Treatment) HPI    Previous symptoms of nausea and diarrhea resolved on 06/27/14. Upset stomach and diarrhea started again today after eating cheescake from [employer]. Unable to go to work today due to symptoms. Has zofran at home which helped w/ previous symptoms but is not taken today. Pepto-Bismol without much relief. Denies abdominal pain, dysuria, frequency, back pain, chest pain, shortness of breath, palpitations, emesis. Diarrhea is nonbloody and loose. 2-4 episodes over the last few hours. Patient feels well otherwise and is able to stay well-hydrated. Symptoms are intermittent.     Past Medical History  Diagnosis Date  . Diabetes mellitus without complication   . GERD (gastroesophageal reflux disease)   . Asthma   . High cholesterol    History reviewed. No pertinent past surgical history. Family History  Problem Relation Age of Onset  . Hypertension Mother   . Diabetes Mother    History  Substance Use Topics  . Smoking status: Current Every Day Smoker -- 0.25 packs/day    Types: Cigarettes  . Smokeless tobacco: Not on file  . Alcohol Use: Yes     Comment: occasional    Review of Systems Per HPI with all other pertinent systems negative.   Allergies  Review of patient's allergies indicates no known allergies.  Home Medications   Prior to Admission medications   Medication Sig Start Date End Date Taking? Authorizing Provider  metFORMIN (GLUCOPHAGE) 500 MG tablet Take 500 mg by mouth daily with breakfast.    Yes Historical Provider, MD  acetaminophen (TYLENOL) 500 MG tablet Take 1,000 mg by mouth 2 (two) times daily.    Historical Provider, MD  albuterol (PROVENTIL HFA;VENTOLIN HFA) 108 (90 BASE) MCG/ACT inhaler Inhale 2 puffs into the lungs every 6 (six)  hours as needed for wheezing or shortness of breath.    Historical Provider, MD  albuterol (PROVENTIL HFA;VENTOLIN HFA) 108 (90 BASE) MCG/ACT inhaler Inhale 1-2 puffs into the lungs every 6 (six) hours as needed for wheezing or shortness of breath. 05/29/14   Reuben Likes, MD  aspirin 81 MG tablet Take 81 mg by mouth daily.    Historical Provider, MD  chlorpheniramine-HYDROcodone (TUSSIONEX) 10-8 MG/5ML LQCR Take 5 mLs by mouth every 12 (twelve) hours as needed for cough. 05/29/14   Reuben Likes, MD  cyclobenzaprine (FLEXERIL) 10 MG tablet Take 10 mg by mouth 3 (three) times daily as needed for muscle spasms.    Historical Provider, MD  fluticasone (FLONASE) 50 MCG/ACT nasal spray Place 2 sprays into both nostrils daily. 03/30/14   Robyn M Hess, PA-C  ipratropium (ATROVENT) 0.06 % nasal spray Place 2 sprays into both nostrils 4 (four) times daily. 02/17/14   Rodolph Bong, MD  ondansetron (ZOFRAN ODT) 8 MG disintegrating tablet Take 1 tablet (8 mg total) by mouth every 8 (eight) hours as needed for nausea or vomiting. 06/25/14   Graylon Good, PA-C  predniSONE (DELTASONE) 20 MG tablet Take 1 tablet (20 mg total) by mouth 2 (two) times daily. 05/29/14   Reuben Likes, MD   BP 132/94 mmHg  Pulse 110  Temp(Src) 98 F (36.7 C) (Oral)  Resp 20  SpO2 96% Physical Exam  Constitutional: He is oriented to person, place, and time. He appears well-developed and well-nourished. No distress.  HENT:  Head: Normocephalic and atraumatic.  Eyes: Pupils are equal, round, and reactive to light.  Neck: Normal range of motion.  Cardiovascular: Normal rate, normal heart sounds and intact distal pulses.   No murmur heard. Pulmonary/Chest: Effort normal and breath sounds normal.  Abdominal: Soft. Bowel sounds are normal. He exhibits no distension and no mass. There is no tenderness. There is no rebound and no guarding.  Musculoskeletal: Normal range of motion.  Neurological: He is oriented to person, place, and  time.  Skin: Skin is warm. He is not diaphoretic.  Psychiatric: He has a normal mood and affect. Judgment and thought content normal.    ED Course  Procedures (including critical care time) Labs Review Labs Reviewed - No data to display  Imaging Review No results found.   MDM   1. Viral gastroenteritis    Likely viral gastroenteritis versus food poisoning versus food allergy. Patient continue with Zofran, reassurance given. Continue with fluids. Copious handwashing. No overt sign of appendicitis, diverticulitis, pancreatitis, cholecystitis or significant bacterial infection.   Precautions given and all questions answered  Shelly Flattenavid Merrell, MD Family Medicine 06/29/2014, 8:33 PM     Ozella Rocksavid J Merrell, MD 06/29/14 2033

## 2014-06-29 NOTE — ED Notes (Signed)
Reports diarrhea onset earlier today Has had 2 loose stools Denies fevers, abd pain, chills, nauseas Alert, no signs of acute distress.

## 2014-06-29 NOTE — Discharge Instructions (Signed)
You likely have a low grade gut infection called viral gastroenteritis Please conintue using the Zofran Please stay well hydrated Please start taking either a probiotic or yogurt with live active cultures.

## 2014-11-14 LAB — HEMOGLOBIN A1C: Hgb A1c MFr Bld: 8.2 % — AB (ref 4.0–6.0)

## 2014-12-04 LAB — HEMOGLOBIN A1C: HEMOGLOBIN A1C: 8 % — AB (ref 4.0–6.0)

## 2015-01-14 ENCOUNTER — Encounter: Payer: Self-pay | Admitting: Endocrinology

## 2015-01-14 ENCOUNTER — Encounter: Payer: Self-pay | Admitting: *Deleted

## 2015-01-14 ENCOUNTER — Ambulatory Visit (INDEPENDENT_AMBULATORY_CARE_PROVIDER_SITE_OTHER): Payer: No Typology Code available for payment source | Admitting: Endocrinology

## 2015-01-14 VITALS — BP 128/86 | HR 79 | Temp 97.7°F | Resp 16 | Ht 69.0 in | Wt 288.4 lb

## 2015-01-14 DIAGNOSIS — E1165 Type 2 diabetes mellitus with hyperglycemia: Secondary | ICD-10-CM | POA: Diagnosis not present

## 2015-01-14 DIAGNOSIS — IMO0002 Reserved for concepts with insufficient information to code with codable children: Secondary | ICD-10-CM

## 2015-01-14 LAB — POCT GLUCOSE (DEVICE FOR HOME USE): GLUCOSE FASTING, POC: 174 mg/dL — AB (ref 70–99)

## 2015-01-14 MED ORDER — VICTOZA 18 MG/3ML ~~LOC~~ SOPN: 1.2000 mg | PEN_INJECTOR | Freq: Every day | SUBCUTANEOUS | Status: DC

## 2015-01-14 NOTE — Progress Notes (Signed)
Patient ID: Eddie Lane, male   DOB: [birth year], 44 y.o.   MRN: [id number]           Reason for Appointment: Consultation for Type 2 Diabetes  Referring physician:  History of Present Illness:          Date of diagnosis of type 2 diabetes mellitus: 2015       Background history:  He had symptoms of increased urination and thirst and dry mouth at the time of diagnosis and he thinks his blood sugars were about 275 He was given metformin and this was increased to 2000 mg At some point this year he was taking Januvia but he took it only for a month and this was not renewed because of insurance non-approval He has not had any diabetes education  Recent history:   He is not referred here for continued poor control with metformin Apparently at an urgent care center because of high reading of 336 he was told to start Novolog at meals based on blood sugar levels However he has not had a functioning glucose monitor for a month and he has not taken much of this He does not think he is having symptoms of increased sugars with frequent urination or excessive fatigue, occasionally has blurred vision  INSULIN regimen is described as:  4-6 units of Novolog rarely when he feels back     Current blood sugar patterns and problems identified:  He has difficulty losing weight  He has not had any diabetes education but is trying to avoid fried food, sometimes will have drinks with sugar  He has not had any blood sugar testing done for some time    Oral hypoglycemic drugs the patient is taking NWG:NFAOZHYQM 1000 mg twice a day      Side effects from medications have been:none  Compliance with the medical regimen: fair Hypoglycemia:none    Glucose monitoring: recently not done         Glucometer: recently received a FreeStyle monitor Blood Glucose readings None:  Self-care: The diet that the patient has been following is: tries to limit fried food.  4 drinks he will have mostlyWater, some kool  aid, sprite, juice;     Typical meal intake: Breakfast is cereal; salads or sandwich at lunch, broiled chicken and vegetables at dinner, has snacks with applesauce and yogurt             Dietician visit, most recent:none               Exercise:  he is usually walking a lot during the day partly because of his work  Weight history: Previous range 250-298   Wt Readings from Last 3 Encounters:  01/14/15 288 lb 6.4 oz (130.817 kg)    Glycemic control:his A1c was 8.2 in June   Lab Results  Component Value Date   HGBA1C 8.0* 12/04/2014   Lab Results  Component Value Date   CREATININE 1.20 03/30/2014         Medication List       This list is accurate as of: 01/14/15  3:04 PM.  Always use your most recent med list.               acetaminophen 500 MG tablet  Commonly known as:  TYLENOL  Take 1,000 mg by mouth 2 (two) times daily.     albuterol 108 (90 BASE) MCG/ACT inhaler  Commonly known as:  PROVENTIL HFA;VENTOLIN HFA  Inhale 2 puffs into the lungs  every 6 (six) hours as needed for wheezing or shortness of breath.     aspirin 81 MG tablet  Take 81 mg by mouth daily.     chlorpheniramine-HYDROcodone 10-8 MG/5ML Lqcr  Commonly known as:  TUSSIONEX  Take 5 mLs by mouth every 12 (twelve) hours as needed for cough.     cyclobenzaprine 10 MG tablet  Commonly known as:  FLEXERIL  Take 10 mg by mouth 3 (three) times daily as needed for muscle spasms.     fluticasone 50 MCG/ACT nasal spray  Commonly known as:  FLONASE  Place 2 sprays into both nostrils daily.     ipratropium 0.06 % nasal spray  Commonly known as:  ATROVENT  Place 2 sprays into both nostrils 4 (four) times daily.     metFORMIN 500 MG tablet  Commonly known as:  GLUCOPHAGE  Take 500 mg by mouth daily with breakfast.     omeprazole 20 MG capsule  Commonly known as:  PRILOSEC     QVAR 40 MCG/ACT inhaler  Generic drug:  beclomethasone     VICTOZA 18 MG/3ML Sopn  Generic drug:  Liraglutide  Inject  0.2 mLs (1.2 mg total) into the skin daily. Inject once daily at the same time        Allergies: No Known Allergies  Past Medical History  Diagnosis Date  . Diabetes mellitus without complication   . GERD (gastroesophageal reflux disease)   . Asthma   . High cholesterol     No past surgical history on file.  Family History  Problem Relation Age of Onset  . Hypertension Mother   . Diabetes Mother   . Diabetes Maternal Uncle     Social History:  reports that he has been smoking Cigarettes.  He has been smoking about 0.25 packs per day. He does not have any smokeless tobacco history on file. He reports that he drinks alcohol. He reports that he does not use illicit drugs.    Review of Systems    Lipid history: he had been on pravastatin and this was stopped because of increased liver functions   No results found for: CHOL, HDL, LDLCALC, LDLDIRECT, TRIG, CHOLHDL         Constitutional: no recent weight gain/loss, no complaints of unusual fatigue   Eyes: Occasional mild history of blurred vision.  Most recent eye exam was several years ago  ENT: no nasal congestion, difficulty swallowing  Cardiovascular: no chest pain or tightness on exertion.  No leg swelling.  Hypertension:not present  Respiratory: no shortness of breath recently, has been treated with medications for asthma  Gastrointestinal: no recent change in bowel habits, nausea or abdominal pain  Musculoskeletal: no muscle/joint aches   Urological:   No frequency of urination or Nocturia recently.   Skin: no rash or infections  Neurological:  Has no numbness, burning, pains or tingling in feet    Psychiatric: no symptoms of depression  Endocrine: No unusual fatigue, cold intolerance or history of thyroid disease   LABS:  Office Visit on 01/14/2015  Component Date Value Ref Range Status  . Hgb A1c MFr Bld 12/04/2014 8.0* 4.0 - 6.0 % Final  . Glucose Fasting, POC 01/14/2015 174* 70 - 99 mg/dL Final     Physical Examination:  BP 128/86 mmHg  Pulse 79  Temp(Src) 97.7 F (36.5 C)  Resp 16  Ht 5\' 9"  (1.753 m)  Wt 288 lb 6.4 oz (130.817 kg)  BMI 42.57 kg/m2  SpO2 97%  GENERAL:         Patient has generalized obesity.   HEENT:         Eye exam shows normal external appearance. Fundus exam shows no retinopathy. Oral exam shows normal mucosa .  NECK:   There is no lymphadenopathy Thyroid is not enlarged and no nodules felt.  Carotids are normal to palpation and no bruit heard LUNGS:         Chest is symmetrical. Lungs are clear to auscultation.Marland Kitchen   HEART:         Heart sounds:  S1 and S2 are normal. No murmur or click heard., no S3 or S4.   ABDOMEN:   There is no distention present. Liver and spleen are not palpable. No other mass or tenderness present.   NEUROLOGICAL:   Vibration sense is mildly reduced in distal first toes. Ankle jerks are absent bilaterally.          Diabetic foot exam shows normal monofilament sensation in the toes and plantar surfaces, no skin lesions or ulcers on the feet and normal pedal pulses MUSCULOSKELETAL:  There is no swelling or deformity of the peripheral joints. Spine is normal to inspection.   EXTREMITIES:     There is no edema. No skin lesions present.Marland Kitchen SKIN:       No rash or lesions of concern.        ASSESSMENT:  Diabetes type 2, uncontrolled    His A1c was 8.2 about 6 weeks ago, primarily being treated with metformin He has not monitored his blood sugar and not clear what his level of control his recently, today glucose is 174 in the office without much food intake He is obese and likely insulin resistant. He has not had any diabetes education and needs to do this. 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 Discussed with the patient the nature of GLP-1 drugs, the actions on various organ systems, how they benefit blood glucose control, as well as the benefit of weight loss and  increase satiety . Explained possible side effects especially nausea and vomiting initially; discussed safety information in package insert.  Described the injection technique and dosage titration of Victoza  starting with 0.6 mg once a day at the same time for the first week and then increasing to 1.2 mg if no symptoms of nausea.  Educational brochure on Victoza and co-pay card given  Continue metformin  Started using FreeStyle monitor for testing blood sugar at least once a day at various times as discussed.  Discussed blood sugar targets.  Consultation with dietitian/diabetes educator for more formal diabetes education and meal planning  Follow-up in one month to review home blood sugars and assess level of control with fructosamine level  Patient Instructions  Start VICTOZA injection as shown once daily at the same time of the day.  Dial the dose to 0.6 mg on the pen for the first week.  You may inject in the stomach, thigh or arm. You may experience nausea in the first few days which usually goes away.  You will feel fullness of the stomach with starting the medication and should try to  keep the portions at meals small.  After 1 week increase the dose to 1.2mg  daily if no nausea present.    If any questions or concerns are present call the office or the Victoza Care helpline at [phone]. Visit [email or web address] for more useful information  Check blood sugars on waking up ..3  .. times a week Also check blood sugars about 2 hours after a meal and do this after different meals by rotation  Recommended blood sugar levels on waking up is 90-130 and about 2 hours after meal is  140-180 Please bring blood sugar monitor to each visit.  Stay on metformin  Protein at each meal    [facility] 01/14/2015, 3:04 PM   Note: This office note was prepared with Insurance underwriter. Any transcriptional errors that result from this process are unintentional.

## 2015-01-14 NOTE — Patient Instructions (Addendum)
Start VICTOZA injection as shown once daily at the same time of the day.  Dial the dose to 0.6 mg on the pen for the first week.  You may inject in the stomach, thigh or arm. You may experience nausea in the first few days which usually goes away.  You will feel fullness of the stomach with starting the medication and should try to keep the portions at meals small.  After 1 week increase the dose to 1.2mg  daily if no nausea present.    If any questions or concerns are present call the office or the Victoza Care helpline at [phone]. Visit [email or web address] for more useful information  Check blood sugars on waking up ..3  .. times a week Also check blood sugars about 2 hours after a meal and do this after different meals by rotation  Recommended blood sugar levels on waking up is 90-130 and about 2 hours after meal is 140-180 Please bring blood sugar monitor to each visit.  Stay on metformin  Protein at each meal

## 2015-01-28 ENCOUNTER — Telehealth: Payer: Self-pay | Admitting: Endocrinology

## 2015-01-28 NOTE — Telephone Encounter (Signed)
Patient stated that the Victoza is making feel weak, throwing up, nauseated, breaking out in a sweat. Please advise

## 2015-01-28 NOTE — Telephone Encounter (Signed)
Please see below and advise.

## 2015-01-29 NOTE — Telephone Encounter (Signed)
Noted, left message for patient

## 2015-01-29 NOTE — Telephone Encounter (Signed)
He can stop this until his next visit

## 2015-01-31 LAB — HEMOGLOBIN A1C: Hgb A1c MFr Bld: 7.7 % — AB (ref 4.0–6.0)

## 2015-02-06 ENCOUNTER — Other Ambulatory Visit: Payer: No Typology Code available for payment source

## 2015-02-06 ENCOUNTER — Other Ambulatory Visit (INDEPENDENT_AMBULATORY_CARE_PROVIDER_SITE_OTHER): Payer: No Typology Code available for payment source

## 2015-02-06 DIAGNOSIS — IMO0002 Reserved for concepts with insufficient information to code with codable children: Secondary | ICD-10-CM

## 2015-02-06 DIAGNOSIS — E1165 Type 2 diabetes mellitus with hyperglycemia: Secondary | ICD-10-CM

## 2015-02-06 LAB — COMPREHENSIVE METABOLIC PANEL
ALBUMIN: 3.9 g/dL (ref 3.5–5.2)
ALT: 74 U/L — AB (ref 0–53)
AST: 55 U/L — AB (ref 0–37)
Alkaline Phosphatase: 59 U/L (ref 39–117)
BILIRUBIN TOTAL: 0.8 mg/dL (ref 0.2–1.2)
BUN: 9 mg/dL (ref 6–23)
CALCIUM: 9 mg/dL (ref 8.4–10.5)
CO2: 28 meq/L (ref 19–32)
CREATININE: 1.17 mg/dL (ref 0.40–1.50)
Chloride: 105 mEq/L (ref 96–112)
GFR: 86.92 mL/min (ref >60.00)
Glucose, Bld: 240 mg/dL — ABNORMAL HIGH (ref 70–99)
Potassium: 4.1 mEq/L (ref 3.5–5.1)
Sodium: 138 mEq/L (ref 135–145)
Total Protein: 7.2 g/dL (ref 6.0–8.3)

## 2015-02-07 LAB — FRUCTOSAMINE: Fructosamine: 263 umol/L (ref 0–285)

## 2015-02-11 ENCOUNTER — Encounter: Payer: Self-pay | Admitting: Endocrinology

## 2015-02-11 ENCOUNTER — Ambulatory Visit (INDEPENDENT_AMBULATORY_CARE_PROVIDER_SITE_OTHER): Payer: No Typology Code available for payment source | Admitting: Endocrinology

## 2015-02-11 ENCOUNTER — Encounter: Payer: Self-pay | Admitting: *Deleted

## 2015-02-11 VITALS — BP 116/84 | HR 85 | Temp 98.4°F | Resp 16 | Ht 69.0 in | Wt 295.2 lb

## 2015-02-11 DIAGNOSIS — IMO0002 Reserved for concepts with insufficient information to code with codable children: Secondary | ICD-10-CM | POA: Insufficient documentation

## 2015-02-11 DIAGNOSIS — E1165 Type 2 diabetes mellitus with hyperglycemia: Secondary | ICD-10-CM | POA: Diagnosis not present

## 2015-02-11 NOTE — Patient Instructions (Signed)
Check cost Trulicity,   Victoza 0.6 for 1 week then add 1 more click daily until maximally tolerated dose  Check blood sugars on waking up .Marland Kitchen 2-3 .Marland Kitchen times a week Also check blood sugars about 2 hours after a meal and do this after different meals by rotation  Recommended blood sugar levels on waking up is 90-130 and about 2 hours after meal is 140-180 Please bring blood sugar monitor to each visit.  Walk daily  No drinks with sugar

## 2015-02-11 NOTE — Progress Notes (Signed)
Patient ID: Eddie Lane, male   DOB: 04/16/1971, 44 y.o.   MRN: [id number]           Reason for Appointment: Follow-up for Type 2 Diabetes  Referring physician: Modesto Charon  History of Present Illness:          Date of diagnosis of type 2 diabetes mellitus: 2015       Background history:  He had symptoms of increased urination and thirst and dry mouth at the time of diagnosis; blood sugars were about 275 He was given metformin and this was increased to 2000 mg At some point this year he was taking Januvia but he took it only for a month and this was not renewed because of insurance non-approval He has not had any diabetes education  Recent history:   He was started on Victoza in addition to his metformin because of significantly poor control on metformin monotherapy He thinks that after increasing the dose to 1.2 mg he started having nausea and vomiting and had to stop the medication However he thinks his blood sugars were better when he was taking the 0.6 mg, does not know the numbers  Current blood sugar patterns and problems identified:  He has difficulty losing weight  He has not changed his diet despite instructions on balancing meals, avoiding simple sugars  He does not do any formal exercise and has actually gained weight  He thinks his blood sugars are still relatively high and was 240 nonfasting in the lab     Oral hypoglycemic drugs the patient is taking WUJ:WJXBJYNWG 1000 mg twice a day      Side effects from medications have been: Vomiting from 1.2 mg Victoza  Compliance with the medical regimen: fair Hypoglycemia:none    Glucose monitoring: recently not done         Glucometer:  FreeStyle monitor Blood Glucose readings 180  Self-care: The diet that the patient has been following is: tries to limit fried food.  4 drinks he will have mostly Water, some kool aid, sprite, juice;     Typical meal intake: Breakfast is cereal; salads or sandwich at lunch, broiled chicken and  vegetables at dinner, has snacks with applesauce and yogurt             Dietician visit, most recent:none               Exercise:  he is usually walking a lot during the day partly because of his work  Weight history: Previous range 250-298   Wt Readings from Last 3 Encounters:  02/11/15 295 lb 3.2 oz (133.902 kg)  01/14/15 288 lb 6.4 oz (130.817 kg)    Glycemic control:his A1c was 8.2 in June   Lab Results  Component Value Date   HGBA1C 8.0* 12/04/2014   HGBA1C 8.2* 11/14/2014   Lab Results  Component Value Date   CREATININE 1.17 02/06/2015        Medication List       This list is accurate as of: 02/11/15  9:52 AM.  Always use your most recent med list.               acetaminophen 500 MG tablet  Commonly known as:  TYLENOL  Take 1,000 mg by mouth 2 (two) times daily.     albuterol 108 (90 BASE) MCG/ACT inhaler  Commonly known as:  PROVENTIL HFA;VENTOLIN HFA  Inhale 2 puffs into the lungs every 6 (six) hours as needed for wheezing or shortness of  breath.     aspirin 81 MG tablet  Take 81 mg by mouth daily.     chlorpheniramine-HYDROcodone 10-8 MG/5ML Lqcr  Commonly known as:  TUSSIONEX  Take 5 mLs by mouth every 12 (twelve) hours as needed for cough.     cyclobenzaprine 10 MG tablet  Commonly known as:  FLEXERIL  Take 10 mg by mouth 3 (three) times daily as needed for muscle spasms.     fluticasone 50 MCG/ACT nasal spray  Commonly known as:  FLONASE  Place 2 sprays into both nostrils daily.     ipratropium 0.06 % nasal spray  Commonly known as:  ATROVENT  Place 2 sprays into both nostrils 4 (four) times daily.     metFORMIN 500 MG tablet  Commonly known as:  GLUCOPHAGE  Take 500 mg by mouth daily with breakfast.     omeprazole 20 MG capsule  Commonly known as:  PRILOSEC     QVAR 40 MCG/ACT inhaler  Generic drug:  beclomethasone     VICTOZA 18 MG/3ML Sopn  Generic drug:  Liraglutide  Inject 0.2 mLs (1.2 mg total) into the skin daily. Inject  once daily at the same time        Allergies: No Known Allergies  Past Medical History  Diagnosis Date  . Diabetes mellitus without complication   . GERD (gastroesophageal reflux disease)   . Asthma   . High cholesterol     No past surgical history on file.  Family History  Problem Relation Age of Onset  . Hypertension Mother   . Diabetes Mother   . Diabetes Maternal Uncle     Social History:  reports that he has been smoking Cigarettes.  He has been smoking about 0.25 packs per day. He does not have any smokeless tobacco history on file. He reports that he drinks alcohol. He reports that he does not use illicit drugs.    Review of Systems    Lipid history: he had been on pravastatin and this was stopped because of increased liver functions   No results found for: CHOL, HDL, LDLCALC, LDLDIRECT, TRIG, CHOLHDL         He is overdue for eye exam   LABS:  Appointment on 02/06/2015  Component Date Value Ref Range Status  . Sodium 02/06/2015 138  135 - 145 mEq/L Final  . Potassium 02/06/2015 4.1  3.5 - 5.1 mEq/L Final  . Chloride 02/06/2015 105  96 - 112 mEq/L Final  . CO2 02/06/2015 28  19 - 32 mEq/L Final  . Glucose, Bld 02/06/2015 240* 70 - 99 mg/dL Final  . BUN 75/64/3329 9  6 - 23 mg/dL Final  . Creatinine, Ser 02/06/2015 1.17  0.40 - 1.50 mg/dL Final  . Total Bilirubin 02/06/2015 0.8  0.2 - 1.2 mg/dL Final  . Alkaline Phosphatase 02/06/2015 59  39 - 117 U/L Final  . AST 02/06/2015 55* 0 - 37 U/L Final  . ALT 02/06/2015 74* 0 - 53 U/L Final  . Total Protein 02/06/2015 7.2  6.0 - 8.3 g/dL Final  . Albumin 51/88/4166 3.9  3.5 - 5.2 g/dL Final  . Calcium 11/15/1599 9.0  8.4 - 10.5 mg/dL Final  . GFR 09/32/3557 86.92  >60.00 mL/min Final  . Fructosamine 02/06/2015 263  0 - 285 umol/L Final   Comment: Published reference interval for apparently healthy subjects between age 22 and 77 is 74 - 285 umol/L and in a poorly controlled diabetic population is 228 - 563  umol/L with a mean of 396 umol/L.     Physical Examination:  BP 116/84 mmHg  Pulse 85  Temp(Src) 98.4 F (36.9 C)  Resp 16  Ht  (1.753 m)  Wt 295 lb 3.2 oz (133.902 kg)  BMI 43.57 kg/m2  SpO2 96%       ASSESSMENT:  Diabetes type 2, uncontrolled    His A1c was 8.2 at baseline and he has failed metformin alone See history of present illness for detailed discussion of his current management, blood sugar patterns and problems identified Although he had GI side effects with 1.2 mg Victoza he thinks his blood sugars were improved even with 0.6 mg but did not try it long enough Fructosamine appears to be not increased  He does not want to try Bydureon because of the larger needle bore Currently also can do better with diet and needs more education Although he thinks he is active he has gained weight since his last visit  PLAN:    Trial of Victoza again using 0.6 mg for the first week and then adding 1 click daily until maximally tolerated dose is achieved  Improve diet  Start regular walking  Consultation with dietitian this week  Bring blood sugar monitor for review, discussed timing and targets of blood sugar  Patient Instructions  Check cost Trulicity,   Victoza 0.6 for 1 week then add 1 more click daily until maximally tolerated dose  Check blood sugars on waking up .Marland Kitchen 2-3 .Marland Kitchen times a week Also check blood sugars about 2 hours after a meal and do this after different meals by rotation  Recommended blood sugar levels on waking up is 90-130 and about 2 hours after meal is 140-180 Please bring blood sugar monitor to each visit.  Walk daily  No drinks with sugar     Eddie Lane 02/11/2015, 9:52 AM   Note: This office note was prepared with Insurance underwriter. Any transcriptional errors that result from this process are unintentional.

## 2015-02-14 ENCOUNTER — Encounter: Payer: Self-pay | Admitting: *Deleted

## 2015-02-14 ENCOUNTER — Encounter: Payer: No Typology Code available for payment source | Attending: Endocrinology | Admitting: *Deleted

## 2015-02-14 VITALS — Ht 69.0 in | Wt 293.0 lb

## 2015-02-14 DIAGNOSIS — E118 Type 2 diabetes mellitus with unspecified complications: Secondary | ICD-10-CM | POA: Insufficient documentation

## 2015-02-14 DIAGNOSIS — Z713 Dietary counseling and surveillance: Secondary | ICD-10-CM | POA: Diagnosis not present

## 2015-02-14 DIAGNOSIS — E1165 Type 2 diabetes mellitus with hyperglycemia: Secondary | ICD-10-CM | POA: Diagnosis not present

## 2015-02-14 NOTE — Patient Instructions (Signed)
Plan:  Aim for 4 Carb Choices per meal (60 grams) +/- 1 either way  Aim for 0-2 Carbs per snack if hungry  Include protein in moderation with your meals and snacks Consider reading food labels for Total Carbohydrate of foods Continue with your activity level by walking daily as tolerated Consider checking BG at alternate times per day as directed by MD  Continue taking Diabetes medications as directed by MD

## 2015-02-17 NOTE — Progress Notes (Signed)
Diabetes Self-Management Education  Visit Type: First/Initial  Appt. Start Time: 0945 Appt. End Time: 1115  02/17/2015  Mr. Eddie Lane, identified by name and date of birth, is a 44 y.o. male with a diagnosis of Diabetes: Type 2.   ASSESSMENT  Height  (1.753 m), weight 293 lb (132.904 kg). Body mass index is 43.25 kg/(m^2).      Diabetes Self-Management Education - 02/14/15 1030    Complications   How often do you check your blood sugar? 0 times/day (not testing)  les than once a day   Fasting Blood glucose range (mg/dL) 91-478   Number of hypoglycemic episodes per month --  not since he came off of the insulin   Patient Education   Disease state  Definition of diabetes, type 1 and 2, and the diagnosis of diabetes   Nutrition management  Role of diet in the treatment of diabetes and the relationship between the three main macronutrients and blood glucose level;Food label reading, portion sizes and measuring food.;Carbohydrate counting   Physical activity and exercise  Role of exercise on diabetes management, blood pressure control and cardiac health.   Medications Reviewed patients medication for diabetes, action, purpose, timing of dose and side effects.   Monitoring Purpose and frequency of SMBG.;Identified appropriate SMBG and/or A1C goals.   Chronic complications Relationship between chronic complications and blood glucose control   Psychosocial adjustment Role of stress on diabetes   Individualized Goals (developed by patient)   Nutrition Follow meal plan discussed   Physical Activity Exercise 3-5 times per week   Medications take my medication as prescribed   Monitoring  test blood glucose pre and post meals as discussed   Outcomes   Expected Outcomes Demonstrated interest in learning. Expect positive outcomes   Future DMSE 4-6 wks   Program Status Not Completed      Individualized Plan for Diabetes Self-Management Training:   Learning Objective:  Patient will  have a greater understanding of diabetes self-management. Patient education plan is to attend individual and/or group sessions per assessed needs and concerns.   Plan:   Patient Instructions  Plan:  Aim for 4 Carb Choices per meal (60 grams) +/- 1 either way  Aim for 0-2 Carbs per snack if hungry  Include protein in moderation with your meals and snacks Consider reading food labels for Total Carbohydrate of foods Continue with your activity level by walking daily as tolerated Consider checking BG at alternate times per day as directed by MD  Continue taking Diabetes medications as directed by MD       Expected Outcomes:  Demonstrated interest in learning. Expect positive outcomes  Education material provided: Living Well with Diabetes, A1C conversion sheet, Meal plan card and Carbohydrate counting sheet  If problems or questions, patient to contact team via:  Phone and Email  Future DSME appointment: 4-6 wks

## 2015-03-11 ENCOUNTER — Ambulatory Visit: Payer: No Typology Code available for payment source | Admitting: Endocrinology

## 2015-03-12 ENCOUNTER — Encounter: Payer: Self-pay | Admitting: Endocrinology

## 2015-03-12 ENCOUNTER — Other Ambulatory Visit: Payer: Self-pay | Admitting: *Deleted

## 2015-03-12 ENCOUNTER — Ambulatory Visit (INDEPENDENT_AMBULATORY_CARE_PROVIDER_SITE_OTHER): Payer: No Typology Code available for payment source | Admitting: Endocrinology

## 2015-03-12 VITALS — BP 130/82 | HR 91 | Temp 98.3°F | Resp 16 | Ht 69.0 in | Wt 288.2 lb

## 2015-03-12 DIAGNOSIS — Z794 Long term (current) use of insulin: Secondary | ICD-10-CM

## 2015-03-12 DIAGNOSIS — E1165 Type 2 diabetes mellitus with hyperglycemia: Secondary | ICD-10-CM

## 2015-03-12 DIAGNOSIS — IMO0001 Reserved for inherently not codable concepts without codable children: Secondary | ICD-10-CM

## 2015-03-12 LAB — POCT GLUCOSE (DEVICE FOR HOME USE): Glucose Fasting, POC: 255 mg/dL — AB (ref 70–99)

## 2015-03-12 MED ORDER — INSULIN PEN NEEDLE 31G X 5 MM MISC: Status: AC

## 2015-03-12 MED ORDER — INSULIN DETEMIR 100 UNIT/ML FLEXPEN: 12.0000 [IU] | PEN_INJECTOR | Freq: Every day | SUBCUTANEOUS | Status: AC

## 2015-03-12 NOTE — Patient Instructions (Signed)
Levemir insulin: This insulin provides blood sugar control for up to 24 hours.  Start with 10 units at bedtime daily and increase by 2 units every 3 days until the waking up sugars are under 130.   Then continue the same dose. If blood sugar is under 90 for 2 days in a row, reduce the dose by 2 units. Note that this insulin does not control the rise of blood sugar with meals    Check blood sugars on waking up 6-7   times a week Also check blood sugars about 2 hours after a meal and do this after different meals by rotation  Recommended blood sugar levels on waking up is 90-130 and about 2 hours after meal is 130-160  Please bring your blood sugar monitor to each visit, thank you  Victoza 1.2mg  daily

## 2015-03-12 NOTE — Progress Notes (Signed)
Patient ID: Eddie Lane, male   DOB: 1971-02-14, 44 y.o.   MRN: [id number]           Reason for Appointment: Follow-up for Type 2 Diabetes  Referring physician: Modesto Charon  History of Present Illness:          Date of diagnosis of type 2 diabetes mellitus: 2015       Background history:  He had symptoms of increased urination and thirst and dry mouth at the time of diagnosis; blood sugars were about 275 He was given metformin and this was increased to 2000 mg At some point this year he was taking Januvia but he took it only for a month and this was not renewed because of insurance non-approval He has not had any diabetes education  Recent history:   He was started on Victoza in addition to his metformin because of significantly poor control on metformin monotherapy He thinks that  Initially after increasing the dose to 1.2 mg he started having nausea and vomiting; he had stopped this on his last visit He was asked to try again and 0.6 mg dosage and he was able to tolerate this and also continue to increase the dose.  On his own he increased the dose to 1.8 milligrams even though he was not supposed to do this However he is checking his blood sugar very sporadically and not clear what his readings are No recent labs available  Current blood sugar patterns and problems identified:  He has been able to lose weight with using Victoza which has reduced his appetite  He has also seen the nutritionist and is making some changes but still drinking sweet tea.  He probably has high postprandial readings but does not know what they are   He takes metformin consistently  Oral hypoglycemic drugs the patient is taking WUJ:WJXBJYNWG 1000 mg twice a day      Side effects from medications have been: Vomiting from 1.2 mg Victoza  Compliance with the medical regimen: fair Hypoglycemia:none    Glucose monitoring: Rarely         Glucometer:  FreeStyle monitor Blood Glucose readings  214 last Sunday am    Self-care: The diet that the patient has been following is: tries to limit fried food.  For drinks he will have mostly Water, some sweet tea    Typical meal intake: Breakfast is cereal or none; salads or sandwich at lunch, broiled chicken and vegetables at dinner, has snacks with applesauce and yogurt             Dietician visit, most recent: 9/16, and follow-up in 11/16               Exercise:  he is usually walking a lot during the day partly because of his work  Weight history: Previous range 250-298   Wt Readings from Last 3 Encounters:  03/12/15 288 lb 3.2 oz (130.727 kg)  02/14/15 293 lb (132.904 kg)  02/11/15 295 lb 3.2 oz (133.902 kg)    Glycemic control:his A1c was 8.2 in June   Lab Results  Component Value Date   HGBA1C 7.7* 01/31/2015   HGBA1C 8.0* 12/04/2014   HGBA1C 8.2* 11/14/2014   Lab Results  Component Value Date   CREATININE 1.17 02/06/2015        Medication List       This list is accurate as of: 03/12/15  9:43 AM.  Always use your most recent med list.  acetaminophen 500 MG tablet  Commonly known as:  TYLENOL  Take 1,000 mg by mouth 2 (two) times daily.     albuterol 108 (90 BASE) MCG/ACT inhaler  Commonly known as:  PROVENTIL HFA;VENTOLIN HFA  Inhale 2 puffs into the lungs every 6 (six) hours as needed for wheezing or shortness of breath.     aspirin 81 MG tablet  Take 81 mg by mouth daily.     cyclobenzaprine 10 MG tablet  Commonly known as:  FLEXERIL  Take 10 mg by mouth 3 (three) times daily as needed for muscle spasms.     fluticasone 50 MCG/ACT nasal spray  Commonly known as:  FLONASE  Place 2 sprays into both nostrils daily.     ipratropium 0.06 % nasal spray  Commonly known as:  ATROVENT  Place 2 sprays into both nostrils 4 (four) times daily.     metFORMIN 500 MG tablet  Commonly known as:  GLUCOPHAGE  Take 500 mg by mouth daily with breakfast.     omeprazole 20 MG capsule  Commonly known as:  PRILOSEC       QVAR 40 MCG/ACT inhaler  Generic drug:  beclomethasone     VICTOZA 18 MG/3ML Sopn  Generic drug:  Liraglutide  Inject 0.2 mLs (1.2 mg total) into the skin daily. Inject once daily at the same time        Allergies: No Known Allergies  Past Medical History  Diagnosis Date  . Diabetes mellitus without complication (HCC)   . GERD (gastroesophageal reflux disease)   . Asthma   . High cholesterol     No past surgical history on file.  Family History  Problem Relation Age of Onset  . Hypertension Mother   . Diabetes Mother   . Diabetes Maternal Uncle     Social History:  reports that he has been smoking Cigarettes.  He has been smoking about 0.25 packs per day. He does not have any smokeless tobacco history on file. He reports that he drinks alcohol. He reports that he does not use illicit drugs.    Review of Systems    Lipid history: he had been on pravastatin and this was stopped because of increased liver functions   No results found for: CHOL, HDL, LDLCALC, LDLDIRECT, TRIG, CHOLHDL         Last foot exam was in 8/16  LABS:  Office Visit on 03/12/2015  Component Date Value Ref Range Status  . Glucose Fasting, POC 03/12/2015 255* 70 - 99 mg/dL Final    Physical Examination:  BP 130/82 mmHg  Pulse 91  Temp(Src) 98.3 F (36.8 C)  Resp 16  Ht 5\' 9"  (1.753 m)  Wt 288 lb 3.2 oz (130.727 kg)  BMI 42.54 kg/m2  SpO2 93%       ASSESSMENT:  Diabetes type 2, uncontrolled  with obesity Currently is on regimen of metformin and Victoza and baseline A1c of 8.2 this year  See history of present illness for detailed discussion of his current management, blood sugar patterns and problems identified  He has been able to take his Victoza again now and even though he was supposed to take only 0.6 he has gone all the way to 1.8 mg without any nausea currently However blood sugars are still probably high especially fasting including a reading of 255 today This is  despite his cutting back on portions and losing weight He is generally active also  Discussed with the patient that this indicates he  has insulin deficiency Not clear if he has postprandial readings that are higher also but these may be reasonably controlled with Victoza and controlling his portions  PLAN:    Trial of Levemir insulin along with Victoza at night.  He will start with 6 units and discussed how to titrate this based on blood sugars every 3 days in the morning.  Given him flowsheet and explain exactly how to do this.  Discussed onset of action and duration of action of Levemir and how to use the Flex pen.  Patient brochure given  Victoza may be reduced to 1.2 mg  Emphasize need for more consistent compliance with all measures of self-care  Bring blood sugar monitor for review, discussed timing of monitoring and targets of blood sugar  Patient Instructions  Levemir insulin: This insulin provides blood sugar control for up to 24 hours.  Start with 10 units at bedtime daily and increase by 2 units every 3 days until the waking up sugars are under 130.   Then continue the same dose. If blood sugar is under 90 for 2 days in a row, reduce the dose by 2 units. Note that this insulin does not control the rise of blood sugar with meals    Check blood sugars on waking up 6-7   times a week Also check blood sugars about 2 hours after a meal and do this after different meals by rotation  Recommended blood sugar levels on waking up is 90-130 and about 2 hours after meal is 130-160  Please bring your blood sugar monitor to each visit, thank you  Victoza 1.2mg  daily     Eddie Lane 03/12/2015, 9:43 AM   Note: This office note was prepared with Insurance underwriter. Any transcriptional errors that result from this process are unintentional.

## 2015-03-28 ENCOUNTER — Other Ambulatory Visit (INDEPENDENT_AMBULATORY_CARE_PROVIDER_SITE_OTHER): Payer: No Typology Code available for payment source

## 2015-03-28 DIAGNOSIS — E1165 Type 2 diabetes mellitus with hyperglycemia: Secondary | ICD-10-CM | POA: Diagnosis not present

## 2015-03-28 DIAGNOSIS — IMO0001 Reserved for inherently not codable concepts without codable children: Secondary | ICD-10-CM

## 2015-03-28 LAB — COMPREHENSIVE METABOLIC PANEL
ALBUMIN: 4 g/dL (ref 3.5–5.2)
ALK PHOS: 63 U/L (ref 39–117)
ALT: 52 U/L (ref 0–53)
AST: 34 U/L (ref 0–37)
BILIRUBIN TOTAL: 0.7 mg/dL (ref 0.2–1.2)
BUN: 13 mg/dL (ref 6–23)
CO2: 26 mEq/L (ref 19–32)
CREATININE: 1.16 mg/dL (ref 0.40–1.50)
Calcium: 9.2 mg/dL (ref 8.4–10.5)
Chloride: 104 mEq/L (ref 96–112)
GFR: 87.73 mL/min (ref >60.00)
GLUCOSE: 133 mg/dL — AB (ref 70–99)
POTASSIUM: 3.8 meq/L (ref 3.5–5.1)
SODIUM: 139 meq/L (ref 135–145)
TOTAL PROTEIN: 7.4 g/dL (ref 6.0–8.3)

## 2015-03-28 LAB — LIPID PANEL
CHOL/HDL RATIO: 5
CHOLESTEROL: 204 mg/dL — AB (ref 0–200)
HDL: 41.5 mg/dL (ref >39.00)
LDL Cholesterol: 140 mg/dL — ABNORMAL HIGH (ref 0–99)
NONHDL: 162.26
Triglycerides: 110 mg/dL (ref 0.0–149.0)
VLDL: 22 mg/dL (ref 0.0–40.0)

## 2015-03-29 ENCOUNTER — Other Ambulatory Visit: Payer: No Typology Code available for payment source

## 2015-03-29 ENCOUNTER — Ambulatory Visit: Payer: No Typology Code available for payment source | Admitting: *Deleted

## 2015-04-03 ENCOUNTER — Ambulatory Visit: Payer: No Typology Code available for payment source | Admitting: Endocrinology

## 2015-06-27 ENCOUNTER — Telehealth: Payer: Self-pay | Admitting: Endocrinology

## 2015-06-27 MED ORDER — VICTOZA 18 MG/3ML ~~LOC~~ SOPN: 1.8000 mg | PEN_INJECTOR | Freq: Every day | SUBCUTANEOUS | Status: DC

## 2015-06-27 NOTE — Telephone Encounter (Signed)
[facility] called in, pt needs refill of Victoza [address] [place]  [phone] FAX [phone]

## 2015-06-27 NOTE — Telephone Encounter (Signed)
Rx sent per pt's request.  

## 2016-04-22 ENCOUNTER — Emergency Department (HOSPITAL_COMMUNITY)
Admission: EM | Admit: 2016-04-22 | Discharge: 2016-04-22 | Disposition: A | Discharge status: Home / Self Care | Payer: No Typology Code available for payment source

## 2017-09-24 ENCOUNTER — Other Ambulatory Visit: Payer: Self-pay

## 2017-09-24 ENCOUNTER — Emergency Department (HOSPITAL_COMMUNITY)
Admission: EM | Admit: 2017-09-24 | Discharge: 2017-09-24 | Disposition: A | Discharge status: Home / Self Care | Payer: No Typology Code available for payment source | Attending: Emergency Medicine | Admitting: Emergency Medicine

## 2017-09-24 ENCOUNTER — Encounter (HOSPITAL_COMMUNITY): Payer: Self-pay | Admitting: Emergency Medicine

## 2017-09-24 DIAGNOSIS — Z7982 Long term (current) use of aspirin: Secondary | ICD-10-CM | POA: Insufficient documentation

## 2017-09-24 DIAGNOSIS — F17210 Nicotine dependence, cigarettes, uncomplicated: Secondary | ICD-10-CM | POA: Insufficient documentation

## 2017-09-24 DIAGNOSIS — Z9119 Patient's noncompliance with other medical treatment and regimen: Secondary | ICD-10-CM | POA: Insufficient documentation

## 2017-09-24 DIAGNOSIS — R42 Dizziness and giddiness: Secondary | ICD-10-CM | POA: Insufficient documentation

## 2017-09-24 DIAGNOSIS — Z91199 Patient's noncompliance with other medical treatment and regimen due to unspecified reason: Secondary | ICD-10-CM

## 2017-09-24 DIAGNOSIS — R3589 Other polyuria: Secondary | ICD-10-CM

## 2017-09-24 DIAGNOSIS — R358 Other polyuria: Secondary | ICD-10-CM | POA: Insufficient documentation

## 2017-09-24 DIAGNOSIS — Z79899 Other long term (current) drug therapy: Secondary | ICD-10-CM | POA: Insufficient documentation

## 2017-09-24 DIAGNOSIS — J45909 Unspecified asthma, uncomplicated: Secondary | ICD-10-CM | POA: Insufficient documentation

## 2017-09-24 DIAGNOSIS — R631 Polydipsia: Secondary | ICD-10-CM | POA: Insufficient documentation

## 2017-09-24 DIAGNOSIS — E1165 Type 2 diabetes mellitus with hyperglycemia: Secondary | ICD-10-CM | POA: Insufficient documentation

## 2017-09-24 DIAGNOSIS — N179 Acute kidney failure, unspecified: Secondary | ICD-10-CM | POA: Insufficient documentation

## 2017-09-24 LAB — BASIC METABOLIC PANEL
Anion gap: 10 (ref 5–15)
BUN: 13 mg/dL (ref 6–20)
CHLORIDE: 97 mmol/L — AB (ref 101–111)
CO2: 22 mmol/L (ref 22–32)
CREATININE: 1.33 mg/dL — AB (ref 0.61–1.24)
Calcium: 9.1 mg/dL (ref 8.9–10.3)
GFR calc non Af Amer: >60 mL/min (ref >60)
Glucose, Bld: 542 mg/dL (ref 65–99)
Potassium: 5.5 mmol/L — ABNORMAL HIGH (ref 3.5–5.1)
SODIUM: 129 mmol/L — AB (ref 135–145)

## 2017-09-24 LAB — BLOOD GAS, VENOUS
Acid-base deficit: 1.9 mmol/L (ref 0.0–2.0)
Bicarbonate: 22.6 mmol/L (ref 20.0–28.0)
O2 Saturation: 94 %
PATIENT TEMPERATURE: 98.6
PH VEN: 7.374 (ref 7.250–7.430)
PO2 VEN: 72.2 mmHg — AB (ref 32.0–45.0)
pCO2, Ven: 39.6 mmHg — ABNORMAL LOW (ref 44.0–60.0)

## 2017-09-24 LAB — CBC
HEMATOCRIT: 44.1 % (ref 39.0–52.0)
Hemoglobin: 15.5 g/dL (ref 13.0–17.0)
MCH: 31 pg (ref 26.0–34.0)
MCHC: 35.1 g/dL (ref 30.0–36.0)
MCV: 88.2 fL (ref 78.0–100.0)
PLATELETS: 158 10*3/uL (ref 150–400)
RBC: 5 MIL/uL (ref 4.22–5.81)
RDW: 12.6 % (ref 11.5–15.5)
WBC: 6.7 10*3/uL (ref 4.0–10.5)

## 2017-09-24 LAB — URINALYSIS, ROUTINE W REFLEX MICROSCOPIC
BACTERIA UA: NONE SEEN
Bilirubin Urine: NEGATIVE
Glucose, UA: >=500 mg/dL — AB
Hgb urine dipstick: NEGATIVE
KETONES UR: NEGATIVE mg/dL
LEUKOCYTES UA: NEGATIVE
Nitrite: NEGATIVE
PH: 5 (ref 5.0–8.0)
Protein, ur: NEGATIVE mg/dL
SPECIFIC GRAVITY, URINE: 1.026 (ref 1.005–1.030)

## 2017-09-24 LAB — CBG MONITORING, ED
GLUCOSE-CAPILLARY: 405 mg/dL — AB (ref 65–99)
Glucose-Capillary: 567 mg/dL (ref 65–99)

## 2017-09-24 LAB — MAGNESIUM: MAGNESIUM: 2.4 mg/dL (ref 1.7–2.4)

## 2017-09-24 MED ORDER — METFORMIN HCL 500 MG PO TABS: 1000.0000 mg | ORAL_TABLET | Freq: Two times a day (BID) | ORAL | 1 refills | Status: DC

## 2017-09-24 MED ORDER — INSULIN DETEMIR 100 UNIT/ML ~~LOC~~ SOLN: 30.0000 [IU] | Freq: Every day | SUBCUTANEOUS | 1 refills | Status: DC

## 2017-09-24 MED ORDER — SODIUM CHLORIDE 0.9 % IV SOLN
INTRAVENOUS | Status: DC
  Administered 2017-09-24: 16:00:00 via INTRAVENOUS

## 2017-09-24 MED ORDER — SODIUM CHLORIDE 0.9 % IV BOLUS
2000.0000 mL | Freq: Once | INTRAVENOUS | Status: AC
  Administered 2017-09-24: 2000 mL via INTRAVENOUS

## 2017-09-24 NOTE — ED Notes (Signed)
Light green tube redrawn and sent to lab. 

## 2017-09-24 NOTE — Care Management Note (Signed)
Case Management Note  CM contacted for no pcp and no ins with medication needs.  CM spoke with pt and spouse at bedside.  Discussed 3 [facility] clinics.  Pt choose [facility] to establish PCP care.  CM made appointment for June 13th which is on AVS.  CM also placed information on AVS for pharmacy and financial counseling.  CM discussed that financial counseling at the [facility] would be able to assist pt with medicare disability and medicaid application.  CM also MATCHed pt and provided [facility] letter with instructions.  [place], [place] aware that pt will need 5 weeks worth of medication to bridge until follow up appointment.  No further CM needs noted at this time.

## 2017-09-24 NOTE — ED Triage Notes (Signed)
Pt reports that he has been out of his meds since November last night and blood sugar has been reading high

## 2017-09-24 NOTE — Discharge Instructions (Addendum)
Your sugars are high because you haven't been taking your medications as directed. Please take all of your diabetes medications as directed. Eat a low carbohydrate diet to help control your sugars as well. Exercise regularly and stay well hydrated as well. Check your sugars regularly and keep a log to help track what your sugars are doing throughout the day, now that you're back on your medications. Follow up with the [facility] as instructed by the care manager today, to establish care and recheck after today's visit, and for ongoing management of your health care. Return to the ER for emergent changes or worsening symptoms.

## 2017-09-24 NOTE — ED Provider Notes (Signed)
[name]   CSN: [id number] Arrival date & time: 09/24/17  1429     History   Chief Complaint Chief Complaint  Patient presents with  . Hyperglycemia    out of meds    HPI Eddie Lane is a 47 y.o. male with a PMHx of DM2, asthma, GERD, and HLD, who presents to the ED with complaints of elevated blood sugars since November when he ran out of his metformin and Levemir.  Patient states he is homeless and has difficulty affording his medications, as well as difficulty following up with his PCP.  He was previously seen by [facility] physicians.  He went to the walk-in clinic today and his CBG read high so they sent him here, no treatments were given.  He states he supposed to be on metformin 1000 mg twice daily and Levemir 30 units at bedtime but has not been taking either of these since November.  He has noticed that he has been very thirsty, having polyuria, polydipsia, blurred vision, fatigue, and intermittent lightheadedness.  He states that eating carbs such as cocoa puffs cereal seems to aggravate his symptoms, no known alleviating factors given that no treatments have been tried prior to arrival or in the last several months.  He noticed over the last few days that his symptoms seem to have worsened which is what prompted him to go to the [place] walk-in clinic, and they sent him here for further evaluation.  He denies any headaches, syncope episodes, fevers, chills, cough, URI symptoms, CP, SOB, abd pain, N/V/D/C, hematuria, dysuria, myalgias, arthralgias, numbness, tingling, focal weakness, or any other complaints at this time.   The history is provided by the patient and medical records. No language interpreter was used.    Past Medical History:  Diagnosis Date  . Asthma   . Diabetes mellitus without complication (HCC)   . GERD (gastroesophageal reflux disease)   . High cholesterol     Patient Active Problem List   Diagnosis Date Noted    . Type II diabetes mellitus, uncontrolled (HCC) 02/11/2015    History reviewed. No pertinent surgical history.      Home Medications    Prior to Admission medications   Medication Sig Start Date End Date Taking? Authorizing Provider  acetaminophen (TYLENOL) 500 MG tablet Take 1,000 mg by mouth 2 (two) times daily.    [provider]  albuterol (PROVENTIL HFA;VENTOLIN HFA) 108 (90 BASE) MCG/ACT inhaler Inhale 2 puffs into the lungs every 6 (six) hours as needed for wheezing or shortness of breath.    [provider]  aspirin 81 MG tablet Take 81 mg by mouth daily.    [provider]  cyclobenzaprine (FLEXERIL) 10 MG tablet Take 10 mg by mouth 3 (three) times daily as needed for muscle spasms.    [provider]  fluticasone (FLONASE) 50 MCG/ACT nasal spray Place 2 sprays into both nostrils daily. 03/30/14   Hess, Nada Boozer, PA-C  Insulin Detemir (LEVEMIR FLEXTOUCH) 100 UNIT/ML Pen Inject 12 Units into the skin daily. 03/12/15   Reather Littler, MD  Insulin Pen Needle 31G X 5 MM MISC Use one per day to inject insulin. 03/12/15   Reather Littler, MD  ipratropium (ATROVENT) 0.06 % nasal spray Place 2 sprays into both nostrils 4 (four) times daily. 02/17/14   Rodolph Bong, MD  metFORMIN (GLUCOPHAGE) 500 MG tablet Take 500 mg by mouth daily with breakfast.     [provider]  omeprazole (PRILOSEC) 20 MG capsule  01/06/15   [provider]  QVAR 40 MCG/ACT inhaler  01/11/15   [provider]  VICTOZA 18 MG/3ML SOPN Inject 0.3 mLs (1.8 mg total) into the skin daily. Inject once daily at the same time 06/27/15   Reather Littler, MD    Family History Family History  Problem Relation Age of Onset  . Hypertension Mother   . Diabetes Mother   . Diabetes Maternal Uncle     Social History Social History   Tobacco Use  . Smoking status: Current Every Day Smoker    Packs/day: 0.25    Types: Cigarettes  . Smokeless tobacco: Never Used  Substance  Use Topics  . Alcohol use: Yes    Comment: occasional  . Drug use: No     Allergies   Patient has no known allergies.   Review of Systems Review of Systems  Constitutional: Positive for fatigue. Negative for chills and fever.  HENT: Negative for rhinorrhea and sore throat.   Eyes: Positive for visual disturbance.  Respiratory: Negative for cough and shortness of breath.   Cardiovascular: Negative for chest pain.  Gastrointestinal: Negative for abdominal pain, constipation, diarrhea, nausea and vomiting.  Endocrine: Positive for polydipsia and polyuria.  Genitourinary: Negative for dysuria and hematuria.  Musculoskeletal: Negative for arthralgias and myalgias.  Skin: Negative for color change.  Allergic/Immunologic: Positive for immunocompromised state (DM2).  Neurological: Positive for light-headedness (intermittent). Negative for syncope, weakness, numbness and headaches.  Psychiatric/Behavioral: Negative for confusion.   All other systems reviewed and are negative for acute change except as noted in the HPI.    Physical Exam Updated Vital Signs BP (!) 137/92 (BP Location: Right Arm)   Pulse 99   Temp 98.7 F (37.1 C)   Resp 18   SpO2 96%    Physical Exam  Constitutional: He is oriented to person, place, and time. Vital signs are normal. He appears well-developed and well-nourished.  Non-toxic appearance. No distress.  Afebrile, nontoxic, NAD  HENT:  Head: Normocephalic and atraumatic.  Mouth/Throat: Oropharynx is clear and moist and mucous membranes are normal.  Eyes: Conjunctivae and EOM are normal. Right eye exhibits no discharge. Left eye exhibits no discharge.  Neck: Normal range of motion. Neck supple.  Cardiovascular: Normal rate, regular rhythm, normal heart sounds and intact distal pulses. Exam reveals no gallop and no friction rub.  No murmur heard. Pulmonary/Chest: Effort normal and breath sounds normal. No respiratory distress. He has no decreased breath  sounds. He has no wheezes. He has no rhonchi. He has no rales.  Abdominal: Soft. Normal appearance and bowel sounds are normal. He exhibits no distension. There is no tenderness. There is no rigidity, no rebound, no guarding, no CVA tenderness, no tenderness at McBurney's point and negative Murphy's sign.  Musculoskeletal: Normal range of motion.  Neurological: He is alert and oriented to person, place, and time. He has normal strength. No sensory deficit.  Skin: Skin is warm, dry and intact. No rash noted.  Psychiatric: He has a normal mood and affect.  Nursing note and vitals reviewed.    ED Treatments / Results  Labs (all labs ordered are listed, but only abnormal results are displayed) Labs Reviewed  URINALYSIS, ROUTINE W REFLEX MICROSCOPIC - Abnormal; Notable for the following components:      Result Value   Color, Urine STRAW (*)    Glucose, UA >=500 (*)    All other components within normal limits  BASIC METABOLIC PANEL -  Abnormal; Notable for the following components:   Sodium 129 (*)    Potassium 5.5 (*)    Chloride 97 (*)    Glucose, Bld 542 (*)    Creatinine, Ser 1.33 (*)    All other components within normal limits  BLOOD GAS, VENOUS - Abnormal; Notable for the following components:   pCO2, Ven 39.6 (*)    pO2, Ven 72.2 (*)    All other components within normal limits  CBG MONITORING, ED - Abnormal; Notable for the following components:   Glucose-Capillary 567 (*)    All other components within normal limits  CBG MONITORING, ED - Abnormal; Notable for the following components:   Glucose-Capillary >600 (*)    All other components within normal limits  CBG MONITORING, ED - Abnormal; Notable for the following components:   Glucose-Capillary 405 (*)    All other components within normal limits  CBC  MAGNESIUM    EKG EKG Interpretation  Date/Time:  Friday Sep 24 2017 17:00:53 EDT Ventricular Rate:  70 PR Interval:    QRS Duration: 111 QT Interval:  422 QTC  Calculation: 456 R Axis:   49 Text Interpretation:  Sinus rhythm Abnormal R-wave progression, early transition No previous ECGs available Confirmed by Richardean Canal [phone]) on 09/24/2017 5:03:45 PM   Radiology No results found.  Procedures Procedures (including critical care time)  Medications Ordered in ED Medications  0.9 %  sodium chloride infusion ( Intravenous New Bag/Given 09/24/17 1613)  sodium chloride 0.9 % bolus 2,000 mL (2,000 mLs Intravenous New Bag/Given 09/24/17 1528)     Initial Impression / Assessment and Plan / ED Course  I have reviewed the triage vital signs and the nursing notes.  Pertinent labs & imaging results that were available during my care of the patient were reviewed by me and considered in my medical decision making (see chart for details).     47 y.o. male here with elevated sugars for a few months, worse in the last few days; having polyuria, polydipsia, blurred vision, fatigue, and lightheadedness. Unremarkable exam, well appearing, in NAD, VSS. Sent here from [facility] in clinic due to CBG HIGH. Work up thus far here shows: CBC WNL; U/A with glucosuria but no ketones and otherwise no evidence of UTI; CBG >600. Pt is homeless and having difficulty affording his meds, which is why his diabetes hasn't been controlled. Will add-on BMP, Mg level, and VBG, and get EKG. Will give 2L bolus, consult care management, and reassess.   5:40 PM BMP with Na 129 likely from hyperglycemia, gluc 542, bicarb and anion gap WNL; K 5.5 but hemolyzed sample, doubt accuracy of this finding; also with Cr 1.33 which is a little bumped from prior values several years ago. Mg level WNL. VBG WNL. EKG nonischemic. Repeat CBG 405 after fluids, which is improving. Pt feeling well and without acute concerns at this time. Pt ambulatory with steady gait. Overall, all his symptoms are likely from his diabetes being under poor control, doubt need for further emergent work up at this time. Care  management stating that he will be given resources and a match letter to help facilitate getting his meds. Refill rx's of metformin and levemir given, advised importance of compliance with this as well as diet/exercise to help control his diabetes as well. Advised f/up with the wellness center for ongoing management of his medical care and for recheck after today's visit. Resources given by care Production designer, theatre/television/film. I explained the diagnosis and have given  explicit precautions to return to the ER including for any other new or worsening symptoms. The patient understands and accepts the medical plan as it's been dictated and I have answered their questions. Discharge instructions concerning home care and prescriptions have been given. The patient is STABLE and is discharged to home in good condition.    Final Clinical Impressions(s) / ED Diagnoses   Final diagnoses:  Type 2 diabetes mellitus with hyperglycemia, unspecified whether long term insulin use (HCC)  Noncompliance with diabetes treatment  AKI (acute kidney injury) (HCC)  Polydipsia  Polyuria  Intermittent lightheadedness    ED Discharge Orders        Ordered    metFORMIN (GLUCOPHAGE) 500 MG tablet  2 times daily with meals     09/24/17 1714    insulin detemir (LEVEMIR) 100 UNIT/ML injection  Daily at bedtime     09/24/17 [address] [place], [place] 09/24/17 1741    Samuel Jester, DO 09/26/17 1323

## 2017-09-27 LAB — CBG MONITORING, ED: Glucose-Capillary: >600 mg/dL (ref 65–99)

## 2017-10-28 ENCOUNTER — Ambulatory Visit (INDEPENDENT_AMBULATORY_CARE_PROVIDER_SITE_OTHER): Payer: No Typology Code available for payment source | Admitting: Physician Assistant

## 2017-12-26 ENCOUNTER — Emergency Department (HOSPITAL_COMMUNITY): Payer: BLUE CROSS/BLUE SHIELD

## 2017-12-26 ENCOUNTER — Encounter (HOSPITAL_COMMUNITY): Payer: Self-pay | Admitting: Emergency Medicine

## 2017-12-26 ENCOUNTER — Emergency Department (HOSPITAL_COMMUNITY)
Admission: EM | Admit: 2017-12-26 | Discharge: 2017-12-26 | Disposition: A | Discharge status: Home / Self Care | Payer: BLUE CROSS/BLUE SHIELD | Attending: Emergency Medicine | Admitting: Emergency Medicine

## 2017-12-26 DIAGNOSIS — J4521 Mild intermittent asthma with (acute) exacerbation: Secondary | ICD-10-CM | POA: Diagnosis not present

## 2017-12-26 DIAGNOSIS — Z794 Long term (current) use of insulin: Secondary | ICD-10-CM | POA: Diagnosis not present

## 2017-12-26 DIAGNOSIS — R002 Palpitations: Secondary | ICD-10-CM | POA: Insufficient documentation

## 2017-12-26 DIAGNOSIS — E119 Type 2 diabetes mellitus without complications: Secondary | ICD-10-CM | POA: Diagnosis not present

## 2017-12-26 DIAGNOSIS — Z7982 Long term (current) use of aspirin: Secondary | ICD-10-CM | POA: Diagnosis not present

## 2017-12-26 DIAGNOSIS — F17210 Nicotine dependence, cigarettes, uncomplicated: Secondary | ICD-10-CM | POA: Insufficient documentation

## 2017-12-26 DIAGNOSIS — R0600 Dyspnea, unspecified: Secondary | ICD-10-CM | POA: Diagnosis present

## 2017-12-26 LAB — I-STAT TROPONIN, ED
Troponin i, poc: 0.01 ng/mL (ref 0.00–0.08)
Troponin i, poc: 0.02 ng/mL (ref 0.00–0.08)

## 2017-12-26 LAB — CBC
HEMATOCRIT: 46.3 % (ref 39.0–52.0)
Hemoglobin: 14.7 g/dL (ref 13.0–17.0)
MCH: 29.2 pg (ref 26.0–34.0)
MCHC: 31.7 g/dL (ref 30.0–36.0)
MCV: 92 fL (ref 78.0–100.0)
Platelets: 162 10*3/uL (ref 150–400)
RBC: 5.03 MIL/uL (ref 4.22–5.81)
RDW: 13.3 % (ref 11.5–15.5)
WBC: 5.6 10*3/uL (ref 4.0–10.5)

## 2017-12-26 LAB — BASIC METABOLIC PANEL
ANION GAP: 8 (ref 5–15)
BUN: 7 mg/dL (ref 6–20)
CO2: 25 mmol/L (ref 22–32)
Calcium: 9 mg/dL (ref 8.9–10.3)
Chloride: 105 mmol/L (ref 98–111)
Creatinine, Ser: 1.1 mg/dL (ref 0.61–1.24)
GFR calc Af Amer: >60 mL/min (ref >60)
GFR calc non Af Amer: >60 mL/min (ref >60)
GLUCOSE: 187 mg/dL — AB (ref 70–99)
Potassium: 3.8 mmol/L (ref 3.5–5.1)
Sodium: 138 mmol/L (ref 135–145)

## 2017-12-26 MED ORDER — GUAIFENESIN 100 MG/5ML PO SYRP: 100.0000 mg | ORAL_SOLUTION | Freq: Four times a day (QID) | ORAL | 0 refills | Status: AC | PRN

## 2017-12-26 MED ORDER — PREDNISONE 20 MG PO TABS: 40.0000 mg | ORAL_TABLET | Freq: Every day | ORAL | 0 refills | Status: AC

## 2017-12-26 MED ORDER — IPRATROPIUM BROMIDE 0.02 % IN SOLN
0.5000 mg | Freq: Once | RESPIRATORY_TRACT | Status: AC
  Administered 2017-12-26: 0.5 mg via RESPIRATORY_TRACT
  Filled 2017-12-26: qty 2.5

## 2017-12-26 MED ORDER — ALBUTEROL SULFATE (2.5 MG/3ML) 0.083% IN NEBU
5.0000 mg | INHALATION_SOLUTION | Freq: Once | RESPIRATORY_TRACT | Status: AC
  Administered 2017-12-26: 5 mg via RESPIRATORY_TRACT
  Filled 2017-12-26: qty 6

## 2017-12-26 MED ORDER — AEROCHAMBER PLUS FLO-VU LARGE MISC
1.0000 | Freq: Once | Status: AC
  Administered 2017-12-26: 1
  Filled 2017-12-26 (×3): qty 1

## 2017-12-26 NOTE — ED Notes (Signed)
ED Provider at bedside. 

## 2017-12-26 NOTE — ED Triage Notes (Signed)
Pt states he began feeling like his heart was beating abnormally and felt like he was having trouble breathing, states he felt like passing out. Pt states he is homeless and living out of his car but states that he did not get overheated prior to symptoms beginning. A/ox4, resp e/u, nad.

## 2017-12-26 NOTE — Social Work (Addendum)
CSW met with pt at pt's bedside. CSW met with pt about homelessness and food insecurities. 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 Pt gave CSW permission to reach out to [employer].   Pt's number is [phone], only working when connected to WiFi at this time.   Pt applied for Food Stamps. Pt informed CSW that they did not give him emergency food stamps. CSW encouraged pt to reach out to DSS to ask for immediate emergency food stamps while he waits for his monthly amount to begin. CSW provided information about free meals and food pantries in [place] that pt and pt's wife could utilize. Pt reported that now they get food at a church on [address] every Tuesday.   CSW spoke with [place] with [employer]. Debbie took pt's name and number. Representative from the [facility] will be reaching out to pt tomorrow to complete an assessment for housing and needs. CSW informed Jackelyn Poling of pt's phone limitations, Jackelyn Poling will make [facility] aware.  CSW updated pt.   Wendelyn Breslow, [facility]  [phone]

## 2017-12-26 NOTE — Discharge Instructions (Addendum)
Thank you for allowing me to care for you today in the Emergency Department.   You can follow-up with your primary care provider regarding today's visit to the emergency department.  If you develop shortness of breath or wheezing, use your albuterol inhaler, 2 puffs every 4 hours, to help with the symptoms.  Starting today, take 2 tablets of prednisone daily for the next 4 days.  Please note, this will make your blood sugar elevate while you are taking this medicine.  Return to the emergency department if you develop your shortness of breath, chest pain and shortness of breath that is pressure-like and worse with exertion or activity, that radiates down the arm of the neck, if you become very sweaty and nauseated, or develop other new, concerning symptoms.

## 2017-12-26 NOTE — ED Provider Notes (Signed)
[facility]   CSN: [id number] Arrival date & time: 12/26/17  1017     History   Chief Complaint Chief Complaint  Patient presents with  . Palpitations    HPI Eddie Lane is a 47 y.o. male with history of hyperlipidemia, HTN, DM Type 2, asthma, and GERD who presents to the emergency department with a chief complaint of dyspnea.  The patient endorses sudden onset dyspnea with associated chest pain, palpitations, and lightheadedness that began this morning.  He reports the symptoms began after he got out of his truck while he was walking to a gas station.  He states that along the walk he got so winded that he had to stop and rest several times because he felt like he was going to pass out.  He reports the symptoms resolved after he sat down and rested.  No history of similar with exertion.  Chest pain has since resolved.  He characterizes the chest pain as sharp and nonradiating.  He also reports a cough with productive sputum for the last few months that has not been worsening since onset.  He denies fever, chills, nausea, vomiting, abdominal pain, syncope, visual changes, dizziness, headache, weakness, or numbness.  He is a current, every day smoker.  He denies alcohol, IV, or recreational drug use.  He reports a family history of CVD.  He reports a history of asthma, but has not been using his home albuterol inhaler recently.  The history is provided by the patient. No language interpreter was used.    Past Medical History:  Diagnosis Date  . Asthma   . Diabetes mellitus without complication (HCC)   . GERD (gastroesophageal reflux disease)   . High cholesterol     Patient Active Problem List   Diagnosis Date Noted  . Type II diabetes mellitus, uncontrolled (HCC) 02/11/2015    History reviewed. No pertinent surgical history.    Home Medications    Prior to Admission medications   Medication Sig Start Date End Date  Taking? Authorizing Provider  albuterol (PROVENTIL HFA;VENTOLIN HFA) 108 (90 BASE) MCG/ACT inhaler Inhale 2 puffs into the lungs every 6 (six) hours as needed for wheezing or shortness of breath.   Yes [provider]  aspirin 81 MG tablet Take 81 mg by mouth daily.   Yes [provider]  escitalopram (LEXAPRO) 20 MG tablet Take 20 mg by mouth daily.    Yes [provider]  Insulin Detemir (LEVEMIR FLEXTOUCH) 100 UNIT/ML Pen Inject 12 Units into the skin daily. 03/12/15  Yes Reather LittlerKumar, Ajay, MD  metFORMIN (GLUCOPHAGE) 1000 MG tablet Take 1,000 mg by mouth 2 (two) times daily. 12/14/17  Yes [provider]  omeprazole (PRILOSEC) 20 MG capsule Take 20 mg by mouth daily.  01/06/15  Yes [provider]  QVAR 40 MCG/ACT inhaler Inhale 1 puff into the lungs daily as needed (sob and wheezing).  01/11/15  Yes [provider]  guaifenesin (ROBITUSSIN) 100 MG/5ML syrup Take 5 mLs (100 mg total) by mouth 4 (four) times daily as needed for cough. 12/26/17   Talbert Trembath A, PA-C  Insulin Pen Needle 31G X 5 MM MISC Use one per day to inject insulin. 03/12/15   Reather LittlerKumar, Ajay, MD  predniSONE (DELTASONE) 20 MG tablet Take 2 tablets (40 mg total) by mouth daily for 4 days. 12/26/17 12/30/17  Londell Noll, Coral ElseMia A, PA-C    Family History Family History  Problem Relation Age of Onset  .  Hypertension Mother   . Diabetes Mother   . Diabetes Maternal Uncle     Social History Social History   Tobacco Use  . Smoking status: Current Every Day Smoker    Packs/day: 0.25    Types: Cigarettes  . Smokeless tobacco: Never Used  Substance Use Topics  . Alcohol use: Yes    Comment: occasional  . Drug use: No     Allergies   Patient has no known allergies.   Review of Systems Review of Systems  Constitutional: Negative for appetite change and fever.  Eyes: Negative for visual disturbance.  Respiratory: Positive for shortness of breath. Negative for cough and wheezing.     Cardiovascular: Positive for chest pain, palpitations and leg swelling.  Gastrointestinal: Negative for abdominal pain, nausea and vomiting.  Genitourinary: Negative for dysuria.  Musculoskeletal: Negative for back pain.  Skin: Negative for rash.  Allergic/Immunologic: Negative for immunocompromised state.  Neurological: Positive for light-headedness. Negative for dizziness, weakness, numbness and headaches.  Psychiatric/Behavioral: Negative for confusion.   Physical Exam Updated Vital Signs BP (!) 140/104   Pulse 72   Temp 98.7 F (37.1 C) (Oral)   Resp 18   SpO2 98%   Physical Exam  Constitutional: He appears well-developed. No distress.  HENT:  Head: Normocephalic.  Eyes: Pupils are equal, round, and reactive to light. Conjunctivae and EOM are normal. No scleral icterus.  Neck: Neck supple. No JVD present.  Cardiovascular: Normal rate, regular rhythm, normal heart sounds and intact distal pulses. Exam reveals no gallop and no friction rub.  No murmur heard. Radial, DP, and PT are 2+ and symmetric.   Pulmonary/Chest: Effort normal. No accessory muscle usage or stridor. No tachypnea. No respiratory distress. He has no decreased breath sounds. He has no wheezes. He has no rales. He exhibits no tenderness.  Lungs are clear to auscultation bilaterally.  Speaks in complete, fluent sentences.  Symmetric inspiration and expiration.  No flail chest.  Abdominal: Soft. He exhibits no distension and no mass. There is no tenderness. There is no rebound and no guarding. No hernia.  Neurological: He is alert.  Skin: Skin is warm and dry. He is not diaphoretic.  Psychiatric: His behavior is normal.  Nursing note and vitals reviewed.  ED Treatments / Results  Labs (all labs ordered are listed, but only abnormal results are displayed) Labs Reviewed  BASIC METABOLIC PANEL - Abnormal; Notable for the following components:      Result Value   Glucose, Bld 187 (*)    All other components  within normal limits  CBC  I-STAT TROPONIN, ED  I-STAT TROPONIN, ED    EKG EKG Interpretation  Date/Time:  Sunday December 26 2017 10:25:45 EDT Ventricular Rate:  89 PR Interval:  148 QRS Duration: 106 QT Interval:  378 QTC Calculation: 459 R Axis:   45 Text Interpretation:  Normal sinus rhythm Incomplete right bundle branch block Borderline ECG No significant change since last tracing Confirmed by Richardean Canal [phone]) on 12/26/2017 1:34:44 PM   Radiology Dg Chest 2 View  Result Date: 12/26/2017 CLINICAL DATA:  Heart palpitations and shortness of breath since this morning. EXAM: CHEST - 2 VIEW COMPARISON:  06/04/2014. FINDINGS: Normal sized heart. Clear lungs with normal vascularity. Left nipple stud. Minimal thoracic spine degenerative changes. IMPRESSION: No acute abnormality. Electronically Signed   By: Beckie Salts M.D.   On: 12/26/2017 11:23    Procedures Procedures (including critical care time)  Medications Ordered in ED Medications  AEROCHAMBER  PLUS FLO-VU LARGE MISC 1 each (has no administration in time range)  albuterol (PROVENTIL) (2.5 MG/3ML) 0.083% nebulizer solution 5 mg (5 mg Nebulization Given 12/26/17 1529)  ipratropium (ATROVENT) nebulizer solution 0.5 mg (0.5 mg Nebulization Given 12/26/17 1529)     Initial Impression / Assessment and Plan / ED Course  I have reviewed the triage vital signs and the nursing notes.  Pertinent labs & imaging results that were available during my care of the patient were reviewed by me and considered in my medical decision making (see chart for details).     47 year old male with history of hyperlipidemia, HTN, DM Type 2, asthma, and GERD presenting with chest pain, dyspnea, lightheadedness, and palpitations.   Chest pain and dyspnea were exertional and improved with rest.  Risk factors for ACS include hypercholesterolemia, HTN, diabetes mellitus type 2, and family history.  Delta troponin is negative.  EKG unchanged from  previous.  Chest x-ray is unremarkable.  Heart score is moderate.  Labs are otherwise reassuring.  Patient was seen and evaluated along with Dr. Silverio Lay, attending physician.  Patient has been chest pain-free and has not had any dyspnea since arrival in the ED.  Patient was ambulated on pulse ox throughout the department and SaO2 decreased to 93% and the patient began to endorse dyspnea.  Albuterol and ipratropium nebulizer treatment given and the patient reported significant improvement in his symptoms.  The patient was re-ambulated on pulse ox by me and maintained an SaO2 of 95 to 97%.  Lungs remain clear to auscultation bilaterally.  Suspect asthma exacerbation as the source of the patient's dyspnea with triggers including increased heat and humidity.  Will give the patient a spacer to use with his home albuterol inhaler and a burst of prednisone.    Also advised the patient follow-up with his primary care provider regarding today's visit.  Social work was also consulted during this visit as the patient is currently suffering from homelessness. Strict return precautions given.  The patient is hemodynamically stable and in no acute distress.  He is safe for discharge home with outpatient follow-up at this time.  Final Clinical Impressions(s) / ED Diagnoses   Final diagnoses:  Mild intermittent asthma with exacerbation    ED Discharge Orders         Ordered    predniSONE (DELTASONE) 20 MG tablet  Daily     12/26/17 1609    guaifenesin (ROBITUSSIN) 100 MG/5ML syrup  4 times daily PRN     12/26/17 1609           Jaide Hillenburg A, PA-C 12/26/17 1617    Charlynne Pander, MD 12/28/17 [phone]

## 2017-12-26 NOTE — ED Notes (Signed)
Discharge instructions [facility] with [facility]. Pt verbalized understanding. Pt stable and ambulatory.

## 2019-10-27 ENCOUNTER — Ambulatory Visit (INDEPENDENT_AMBULATORY_CARE_PROVIDER_SITE_OTHER): Payer: Self-pay | Admitting: Otolaryngology

## 2019-11-06 ENCOUNTER — Ambulatory Visit (INDEPENDENT_AMBULATORY_CARE_PROVIDER_SITE_OTHER): Payer: BC Managed Care – PPO | Admitting: Otolaryngology

## 2019-11-06 ENCOUNTER — Other Ambulatory Visit: Payer: Self-pay

## 2019-11-06 ENCOUNTER — Encounter (INDEPENDENT_AMBULATORY_CARE_PROVIDER_SITE_OTHER): Payer: Self-pay | Admitting: Otolaryngology

## 2019-11-06 VITALS — Temp 96.6°F

## 2019-11-06 DIAGNOSIS — H6522 Chronic serous otitis media, left ear: Secondary | ICD-10-CM | POA: Diagnosis not present

## 2019-11-06 NOTE — Progress Notes (Signed)
HPI: Eddie Lane is a 49 y.o. male who returns today for evaluation of decreased hearing in his left ear.  He has had bilateral T tubes for a number of years.  The left T-tube was last placed in 2017.  He has noticed decreased hearing in the left ear over the past few months.  Presents today to have the left ear cleaned and checked..  Past Medical History:  Diagnosis Date  . Asthma   . Diabetes mellitus without complication ([place])   . GERD (gastroesophageal reflux disease)   . High cholesterol    No past surgical history on file. Social History   Socioeconomic History  . Marital status: Married    Spouse name: Not on file  . Number of children: Not on file  . Years of education: Not on file  . Highest education level: Not on file  Occupational History  . Not on file  Tobacco Use  . Smoking status: Former Smoker    Packs/day: 0.25    Years: 4.00    Pack years: 1.00    Types: Cigarettes    Start date: 2013    Quit date: 2017    Years since quitting: 4.4  . Smokeless tobacco: Never Used  Vaping Use  . Vaping Use: Never used  Substance and Sexual Activity  . Alcohol use: Yes    Comment: occasional  . Drug use: No  . Sexual activity: Not on file  Other Topics Concern  . Not on file  Social History Narrative  . Not on file   Social Determinants of Health   Financial Resource Strain:   . Difficulty of Paying Living Expenses:   Food Insecurity:   . Worried About Charity fundraiser in the Last Year:   . Arboriculturist in the Last Year:   Transportation Needs:   . Film/video editor (Medical):   Marland Kitchen Lack of Transportation (Non-Medical):   Physical Activity:   . Days of Exercise per Week:   . Minutes of Exercise per Session:   Stress:   . Feeling of Stress :   Social Connections:   . Frequency of Communication with Friends and Family:   . Frequency of Social Gatherings with Friends and Family:   . Attends Religious Services:   . Active Member of Clubs or  Organizations:   . Attends Archivist Meetings:   Marland Kitchen Marital Status:    Family History  Problem Relation Age of Onset  . Hypertension Mother   . Diabetes Mother   . Diabetes Maternal Uncle    No Known Allergies Prior to Admission medications   Medication Sig Start Date End Date Taking? Authorizing Provider  albuterol (PROVENTIL HFA;VENTOLIN HFA) 108 (90 BASE) MCG/ACT inhaler Inhale 2 puffs into the lungs every 6 (six) hours as needed for wheezing or shortness of breath.   Yes [provider]  aspirin 81 MG tablet Take 81 mg by mouth daily.   Yes [provider]  escitalopram (LEXAPRO) 20 MG tablet Take 20 mg by mouth daily.    Yes [provider]  guaifenesin (ROBITUSSIN) 100 MG/5ML syrup Take 5 mLs (100 mg total) by mouth 4 (four) times daily as needed for cough. 12/26/17  Yes McDonald, Mia A, PA-C  Insulin Detemir (LEVEMIR FLEXTOUCH) 100 UNIT/ML Pen Inject 12 Units into the skin daily. 03/12/15  Yes Elayne Snare, MD  Insulin Pen Needle 31G X 5 MM MISC Use one per day to inject insulin. 03/12/15  Yes Reather Littler, MD  metFORMIN (GLUCOPHAGE) 1000 MG tablet Take 1,000 mg by mouth 2 (two) times daily. 12/14/17  Yes [provider]  omeprazole (PRILOSEC) 20 MG capsule Take 20 mg by mouth daily.  01/06/15  Yes [provider]  QVAR 40 MCG/ACT inhaler Inhale 1 puff into the lungs daily as needed (sob and wheezing).  01/11/15  Yes [provider]     Positive ROS: Otherwise negative  All other systems have been reviewed and were otherwise negative with the exception of those mentioned in the HPI and as above.  Physical Exam: Constitutional: Alert, well-appearing, no acute distress Ears: External ears without lesions or tenderness.  Right T-tube is in good position and patent with a clear right TM.  The left T-tube is lying within the ear canal with a retracted left TM and serous otitis media. Procedure repeat left M&T was performed in  the office using modified T-tube.  Serous effusion was aspirated.  Hearing was better after placement of the T-tube. Nasal: External nose without lesion. Clear nasal passages Oral: Lips and gums without lesions. Tongue and palate mucosa without lesions. Posterior oropharynx clear. Neck: No palpable adenopathy or masses Respiratory: Breathing comfortably  Skin: No facial/neck lesions or rash noted.  Myringotomy  Date/Time: 11/06/2019 3:18 PM Performed by: Drema Halon, MD Authorized by: Drema Halon, MD   Consent:    Consent obtained:  Verbal   Consent given by:  Patient   Risks discussed:  Bleeding and pain   Alternatives discussed:  No treatment Pre-procedure details:    Indications: serous otitis media   Anesthesia:    Anesthesia method:  Topical application   Topical anesthetic:  Phenol Procedure Details:    Location:  Left TM   Hole made in:  Anterosuperior aspect of TM   Tube:  T-tube Findings:    Fluid:  Serous fluid Post-procedure details:    Patient tolerance of procedure:  Tolerated well, no immediate complications Comments:     Myringotomy was performed anteriorly and a modified T-tube was inserted without difficulty.    Assessment: Recurrent left serous otitis media with decreased hearing following previous left M&T with T-tube 4 years ago.  Plan: Repeat left M&T with T-tube was performed in the office today. Gave him a prescription for Ciprodex drops to use if he develops any drainage from the ear. Recommend keeping water out of the left ear. He will follow-up in 2 weeks for recheck.   Narda Bonds, MD

## 2019-11-27 ENCOUNTER — Other Ambulatory Visit: Payer: Self-pay

## 2019-11-27 ENCOUNTER — Ambulatory Visit (INDEPENDENT_AMBULATORY_CARE_PROVIDER_SITE_OTHER): Payer: BC Managed Care – PPO | Admitting: Otolaryngology

## 2019-11-27 VITALS — Temp 95.0°F

## 2019-11-27 DIAGNOSIS — Z4889 Encounter for other specified surgical aftercare: Secondary | ICD-10-CM

## 2019-11-27 NOTE — Progress Notes (Signed)
HPI: Eddie Lane is a 49 y.o. male who presents 21 days s/p placement of left ear T-tube.  He is doing well with no drainage from the ears but still complains of ringing in the left ear..   Past Medical History:  Diagnosis Date  . Asthma   . Diabetes mellitus without complication ([facility])   . GERD (gastroesophageal reflux disease)   . High cholesterol    No past surgical history on file. Social History   Socioeconomic History  . Marital status: Married    Spouse name: Not on file  . Number of children: Not on file  . Years of education: Not on file  . Highest education level: Not on file  Occupational History  . Not on file  Tobacco Use  . Smoking status: Former Smoker    Packs/day: 0.25    Years: 4.00    Pack years: 1.00    Types: Cigarettes    Start date: 2013    Quit date: 2017    Years since quitting: 4.5  . Smokeless tobacco: Never Used  Vaping Use  . Vaping Use: Never used  Substance and Sexual Activity  . Alcohol use: Yes    Comment: occasional  . Drug use: No  . Sexual activity: Not on file  Other Topics Concern  . Not on file  Social History Narrative  . Not on file   Social Determinants of Health   Financial Resource Strain:   . Difficulty of Paying Living Expenses:   Food Insecurity:   . Worried About Programme researcher, broadcasting/film/video in the Last Year:   . Barista in the Last Year:   Transportation Needs:   . Freight forwarder (Medical):   Marland Kitchen Lack of Transportation (Non-Medical):   Physical Activity:   . Days of Exercise per Week:   . Minutes of Exercise per Session:   Stress:   . Feeling of Stress :   Social Connections:   . Frequency of Communication with Friends and Family:   . Frequency of Social Gatherings with Friends and Family:   . Attends Religious Services:   . Active Member of Clubs or Organizations:   . Attends Banker Meetings:   Marland Kitchen Marital Status:    Family History  Problem Relation Age of Onset  . Hypertension Mother    . Diabetes Mother   . Diabetes Maternal Uncle    No Known Allergies Prior to Admission medications   Medication Sig Start Date End Date Taking? Authorizing Provider  albuterol (PROVENTIL HFA;VENTOLIN HFA) 108 (90 BASE) MCG/ACT inhaler Inhale 2 puffs into the lungs every 6 (six) hours as needed for wheezing or shortness of breath.   Yes [provider]  aspirin 81 MG tablet Take 81 mg by mouth daily.   Yes [provider]  escitalopram (LEXAPRO) 20 MG tablet Take 20 mg by mouth daily.    Yes [provider]  guaifenesin (ROBITUSSIN) 100 MG/5ML syrup Take 5 mLs (100 mg total) by mouth 4 (four) times daily as needed for cough. 12/26/17  Yes McDonald, Mia A, PA-C  Insulin Detemir (LEVEMIR FLEXTOUCH) 100 UNIT/ML Pen Inject 12 Units into the skin daily. 03/12/15  Yes Reather Littler, MD  Insulin Pen Needle 31G X 5 MM MISC Use one per day to inject insulin. 03/12/15  Yes Reather Littler, MD  metFORMIN (GLUCOPHAGE) 1000 MG tablet Take 1,000 mg by mouth 2 (two) times daily. 12/14/17  Yes [provider]  omeprazole (PRILOSEC)  20 MG capsule Take 20 mg by mouth daily.  01/06/15  Yes [provider]  QVAR 40 MCG/ACT inhaler Inhale 1 puff into the lungs daily as needed (sob and wheezing).  01/11/15  Yes [provider]     Physical Exam: Both T tubes are in good position and are clear with clear TMs bilaterally.   Assessment: S/p left M&T with a T-tube. Persistent tinnitus  Plan: Suggested trying Lipo flavonoid plus for the tinnitus as this is beneficial in some people. If the tinnitus worsens would recommend obtaining hearing test. Both T tubes in TMs are clear bilaterally.   Narda Bonds, MD

## 2020-01-24 ENCOUNTER — Other Ambulatory Visit: Payer: Self-pay

## 2020-01-24 ENCOUNTER — Emergency Department (HOSPITAL_COMMUNITY)
Admission: EM | Admit: 2020-01-24 | Discharge: 2020-01-24 | Disposition: A | Discharge status: Home / Self Care | Payer: BC Managed Care – PPO | Attending: Emergency Medicine | Admitting: Emergency Medicine

## 2020-01-24 ENCOUNTER — Encounter (HOSPITAL_COMMUNITY): Payer: Self-pay | Admitting: *Deleted

## 2020-01-24 DIAGNOSIS — K5900 Constipation, unspecified: Secondary | ICD-10-CM | POA: Diagnosis not present

## 2020-01-24 DIAGNOSIS — Z5321 Procedure and treatment not carried out due to patient leaving prior to being seen by health care provider: Secondary | ICD-10-CM | POA: Diagnosis not present

## 2020-01-24 DIAGNOSIS — R1032 Left lower quadrant pain: Secondary | ICD-10-CM | POA: Diagnosis present

## 2020-01-24 LAB — URINALYSIS, ROUTINE W REFLEX MICROSCOPIC
Bacteria, UA: NONE SEEN
Bilirubin Urine: NEGATIVE
Glucose, UA: >=500 mg/dL — AB
Hgb urine dipstick: NEGATIVE
Ketones, ur: 5 mg/dL — AB
Leukocytes,Ua: NEGATIVE
Nitrite: NEGATIVE
Protein, ur: NEGATIVE mg/dL
Specific Gravity, Urine: 1.032 — ABNORMAL HIGH (ref 1.005–1.030)
pH: 5 (ref 5.0–8.0)

## 2020-01-24 LAB — COMPREHENSIVE METABOLIC PANEL
ALT: 60 U/L — ABNORMAL HIGH (ref 0–44)
AST: 48 U/L — ABNORMAL HIGH (ref 15–41)
Albumin: 3.8 g/dL (ref 3.5–5.0)
Alkaline Phosphatase: 138 U/L — ABNORMAL HIGH (ref 38–126)
Anion gap: 10 (ref 5–15)
BUN: 12 mg/dL (ref 6–20)
CO2: 24 mmol/L (ref 22–32)
Calcium: 9.3 mg/dL (ref 8.9–10.3)
Chloride: 100 mmol/L (ref 98–111)
Creatinine, Ser: 1.08 mg/dL (ref 0.61–1.24)
GFR calc Af Amer: >60 mL/min (ref >60)
GFR calc non Af Amer: >60 mL/min (ref >60)
Glucose, Bld: 304 mg/dL — ABNORMAL HIGH (ref 70–99)
Potassium: 4.6 mmol/L (ref 3.5–5.1)
Sodium: 134 mmol/L — ABNORMAL LOW (ref 135–145)
Total Bilirubin: 1.4 mg/dL — ABNORMAL HIGH (ref 0.3–1.2)
Total Protein: 8 g/dL (ref 6.5–8.1)

## 2020-01-24 LAB — CBG MONITORING, ED: Glucose-Capillary: 263 mg/dL — ABNORMAL HIGH (ref 70–99)

## 2020-01-24 LAB — CBC
HCT: 47.2 % (ref 39.0–52.0)
Hemoglobin: 15.8 g/dL (ref 13.0–17.0)
MCH: 29.6 pg (ref 26.0–34.0)
MCHC: 33.5 g/dL (ref 30.0–36.0)
MCV: 88.4 fL (ref 80.0–100.0)
Platelets: 199 10*3/uL (ref 150–400)
RBC: 5.34 MIL/uL (ref 4.22–5.81)
RDW: 12.8 % (ref 11.5–15.5)
WBC: 9.4 10*3/uL (ref 4.0–10.5)
nRBC: 0 % (ref 0.0–0.2)

## 2020-01-24 LAB — LIPASE, BLOOD: Lipase: 38 U/L (ref 11–51)

## 2020-01-24 NOTE — ED Triage Notes (Signed)
Pt states he has been having on and off pain in LLQ since Sunday. Mild constipation. Pt denies urinary symptoms. Thinks he ate something causing his symptoms.

## 2020-01-26 ENCOUNTER — Other Ambulatory Visit: Payer: Self-pay | Admitting: Family Medicine

## 2020-01-26 DIAGNOSIS — R1084 Generalized abdominal pain: Secondary | ICD-10-CM

## 2020-01-30 ENCOUNTER — Ambulatory Visit
Admission: RE | Admit: 2020-01-30 | Discharge: 2020-01-30 | Disposition: A | Discharge status: Home / Self Care | Payer: BC Managed Care – PPO | Source: Ambulatory Visit | Attending: Family Medicine | Admitting: Family Medicine

## 2020-01-30 DIAGNOSIS — R1084 Generalized abdominal pain: Secondary | ICD-10-CM

## 2020-02-06 ENCOUNTER — Other Ambulatory Visit: Payer: Self-pay | Admitting: Family Medicine

## 2020-02-09 ENCOUNTER — Other Ambulatory Visit: Payer: Self-pay | Admitting: Family Medicine

## 2020-02-09 DIAGNOSIS — K7400 Hepatic fibrosis, unspecified: Secondary | ICD-10-CM

## 2020-02-09 DIAGNOSIS — R935 Abnormal findings on diagnostic imaging of other abdominal regions, including retroperitoneum: Secondary | ICD-10-CM

## 2020-03-05 ENCOUNTER — Ambulatory Visit
Admission: RE | Admit: 2020-03-05 | Discharge: 2020-03-05 | Disposition: A | Discharge status: Home / Self Care | Payer: BC Managed Care – PPO | Source: Ambulatory Visit | Attending: Family Medicine | Admitting: Family Medicine

## 2020-03-05 DIAGNOSIS — R935 Abnormal findings on diagnostic imaging of other abdominal regions, including retroperitoneum: Secondary | ICD-10-CM

## 2020-03-05 DIAGNOSIS — K7400 Hepatic fibrosis, unspecified: Secondary | ICD-10-CM

## 2020-03-05 MED ORDER — GADOBENATE DIMEGLUMINE 529 MG/ML IV SOLN
20.0000 mL | Freq: Once | INTRAVENOUS | Status: AC | PRN
  Administered 2020-03-05: 20 mL via INTRAVENOUS

## 2020-04-08 ENCOUNTER — Ambulatory Visit (INDEPENDENT_AMBULATORY_CARE_PROVIDER_SITE_OTHER): Payer: Self-pay | Admitting: Otolaryngology

## 2021-04-15 IMAGING — MR MR ABDOMEN WO/W CM
17 series · 48 of 48 positions shown · IV contrast (20 ml multihance)
Comparison: Ultrasound on 01/30/2020

CLINICAL DATA: Possible hepatic cirrhosis and hepatic mass on
recent ultrasound.

EXAM:
MRI ABDOMEN WITHOUT AND WITH CONTRAST
TECHNIQUE: Multiplanar multisequence MR imaging of the abdomen was performed
both before and after the administration of intravenous contrast.
CONTRAST:  20mL MULTIHANCE GADOBENATE DIMEGLUMINE 529 MG/ML IV SOLN

[Series 3: T2 · coronal · 5.0mm · 1.56mm/px · 2 of 38 slices shown (1 of 3)]
[im 1/38]
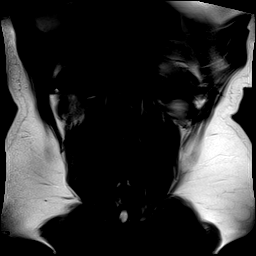
[im 38/38]
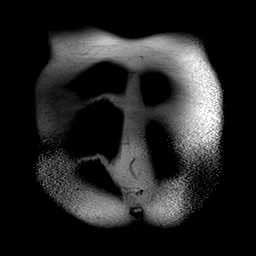

[Series 4: T1 · axial · 3.0mm · 1.19mm/px · z∈[-75,+162]mm · 7 of 160 slices shown]
[im 1/160]
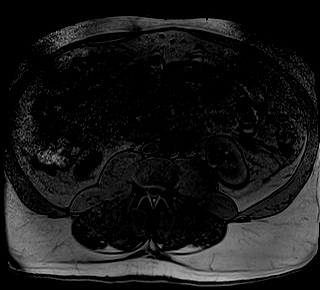
[im 27/160]
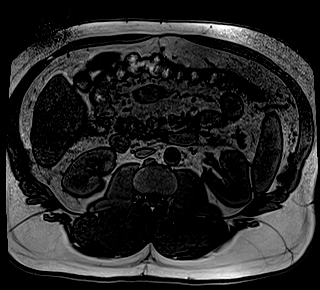
[im 54/160]
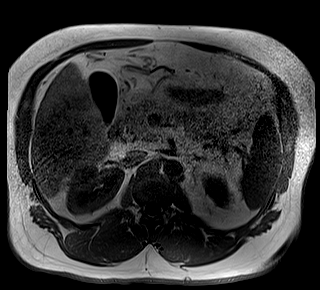
[im 80/160]
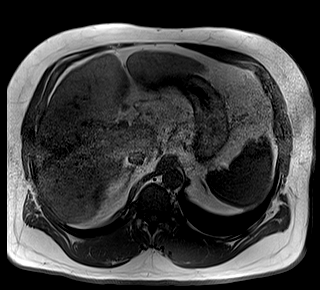
[im 107/160]
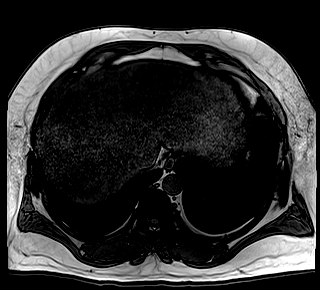
[im 133/160]
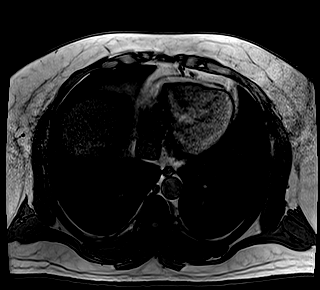
[im 160/160]
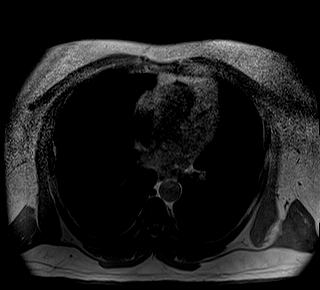

[Series 5: bSSFP · axial · 5.0mm · 1.25mm/px · z∈[-74,+160]mm · 2 of 40 slices shown]
[im 1/40]
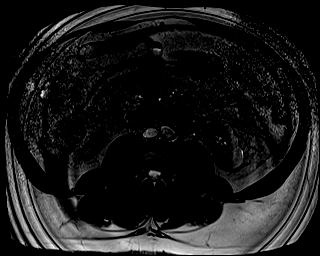
[im 40/40]
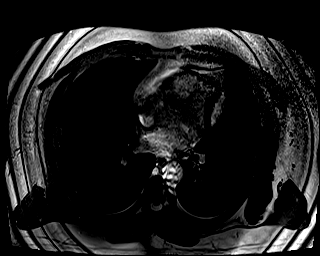

[Series 6: T2 · axial · 5.0mm · 1.48mm/px · 1 of 40 slices shown (2 of 3)]
[im 1/40]
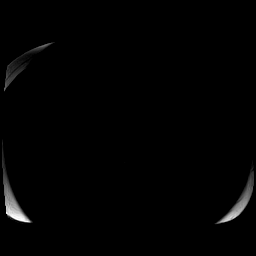

[Series 7: DWI · axial · 5.0mm · 1.42mm/px · z∈[-44,+178]mm · 4 of 114 slices shown (1 of 2)]
[im 1/114]
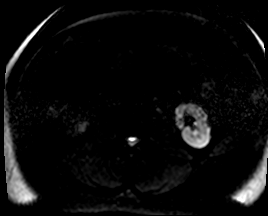
[im 38/114]
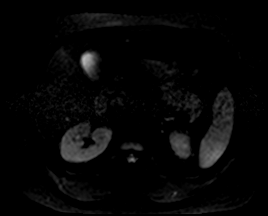
[im 76/114]
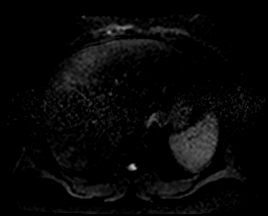
[im 114/114]
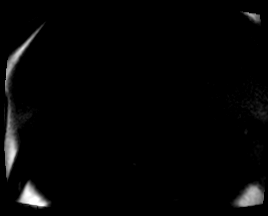

[Series 8: DWI · axial · 5.0mm · 1.42mm/px · 1 of 38 slices shown (2 of 2)]
[im 1/38]
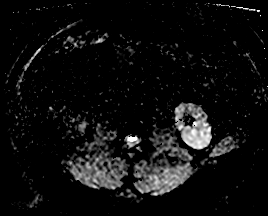

[Series 9: T2 · axial · 6.0mm · 1.19mm/px · 1 of 34 slices shown (3 of 3)]
[im 1/34]
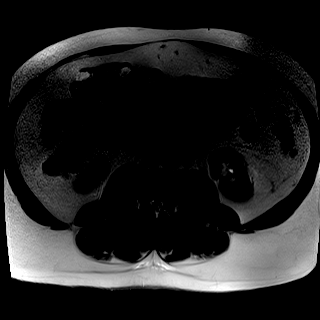

[Series 10: T1 dynamic · axial · non-contrast · 3.0mm · 1.25mm/px · z∈[-86,+175]mm · 3 of 88 slices shown]
[im 1/88]
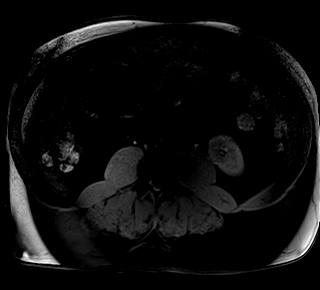
[im 44/88]
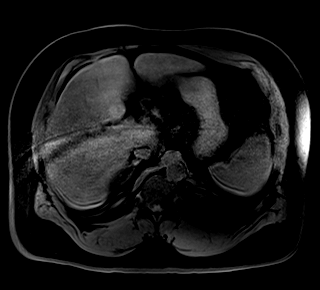
[im 88/88]
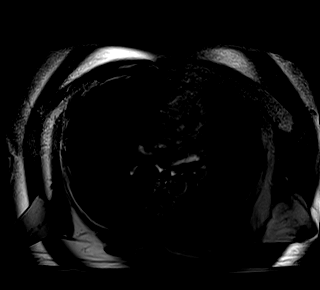

[Series 11: T1 dynamic post-contrast · axial · 3.0mm · 1.25mm/px · z∈[-86,+175]mm · 3 of 88 slices shown (1 of 9)]
[im 1/88]
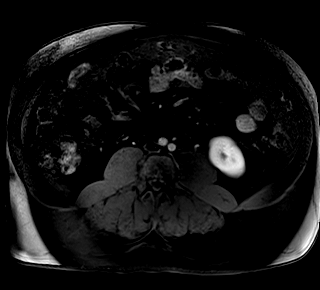
[im 44/88]
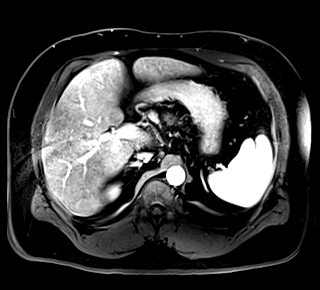
[im 88/88]
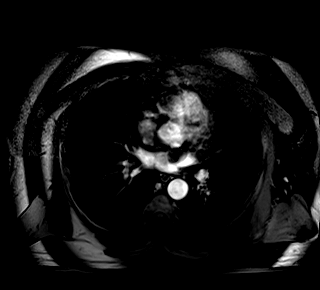

[Series 12: T1 dynamic post-contrast · axial · 3.0mm · 1.25mm/px · z∈[-86,+175]mm · 3 of 88 slices shown (2 of 9)]
[im 1/88]
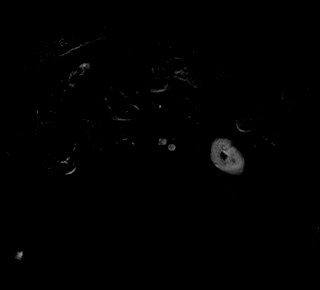
[im 44/88]
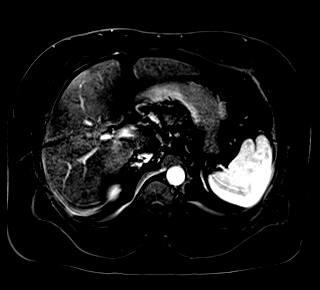
[im 88/88]
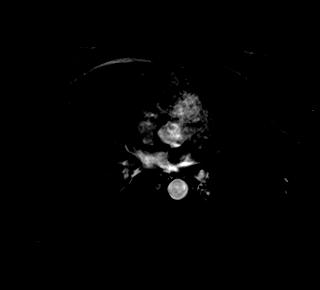

[Series 13: T1 dynamic post-contrast · axial · 3.0mm · 1.25mm/px · z∈[-86,+175]mm · 3 of 88 slices shown (3 of 9)]
[im 1/88]
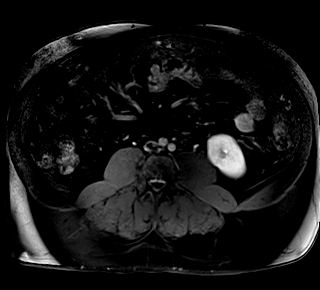
[im 44/88]
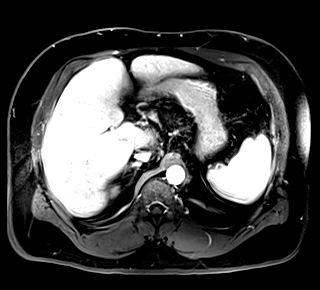
[im 88/88]
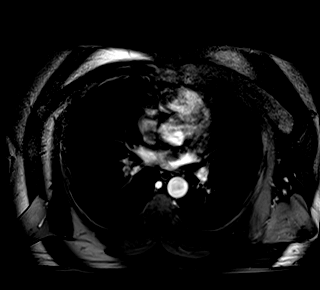

[Series 14: T1 dynamic post-contrast · axial · 3.0mm · 1.25mm/px · z∈[-86,+175]mm · 3 of 88 slices shown (4 of 9)]
[im 1/88]
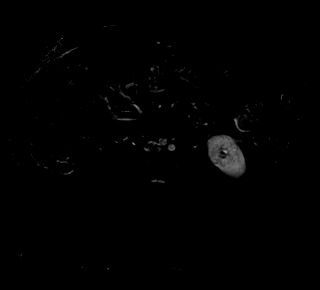
[im 44/88]
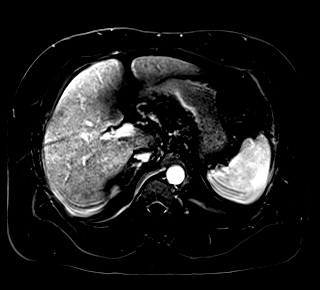
[im 88/88]
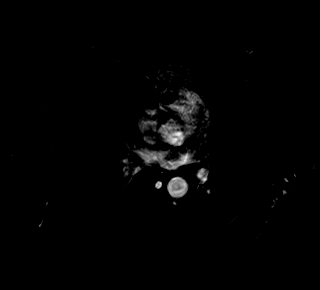

[Series 15: T1 dynamic post-contrast · axial · 3.0mm · 1.25mm/px · z∈[-86,+175]mm · 3 of 88 slices shown (5 of 9)]
[im 1/88]
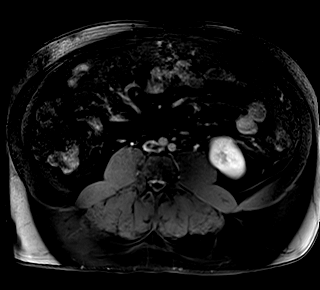
[im 44/88]
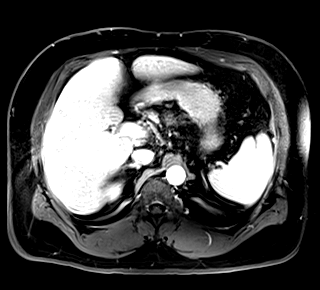
[im 88/88]
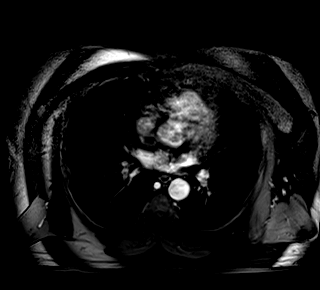

[Series 16: T1 dynamic post-contrast · axial · 3.0mm · 1.25mm/px · z∈[-86,+175]mm · 3 of 88 slices shown (6 of 9)]
[im 1/88]
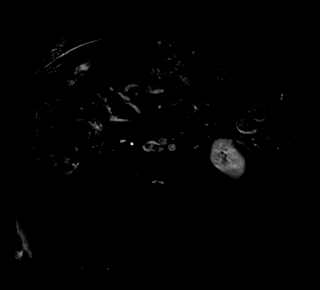
[im 44/88]
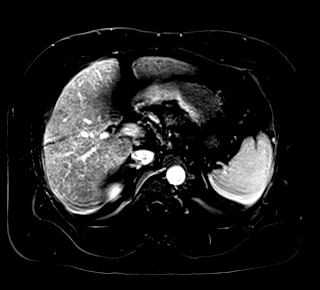
[im 88/88]
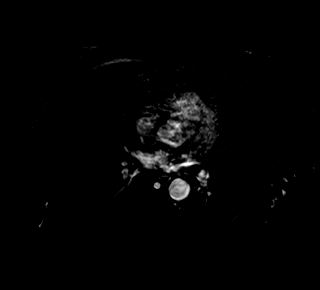

[Series 17: T1 dynamic post-contrast · coronal · 3.0mm · 1.25mm/px · 3 of 80 slices shown (7 of 9)]
[im 1/80]
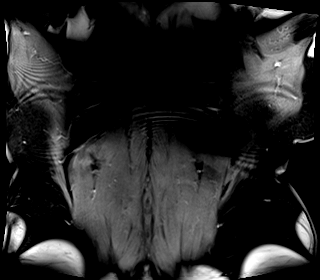
[im 40/80]
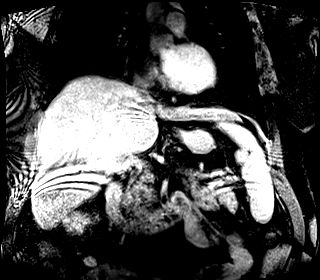
[im 80/80]
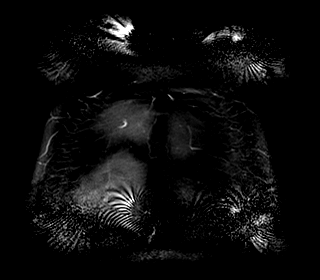

[Series 18: T1 dynamic post-contrast · axial · 3.0mm · 1.25mm/px · z∈[-86,+175]mm · 3 of 88 slices shown (8 of 9)]
[im 1/88]
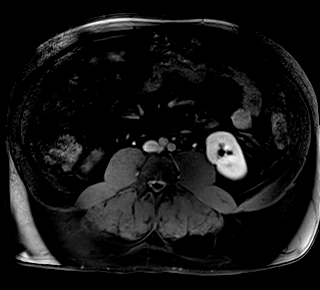
[im 44/88]
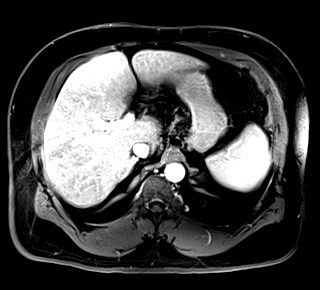
[im 88/88]
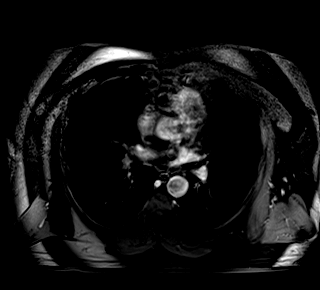

[Series 19: T1 dynamic post-contrast · axial · 3.0mm · 1.25mm/px · z∈[-86,+175]mm · 3 of 88 slices shown (9 of 9)]
[im 1/88]
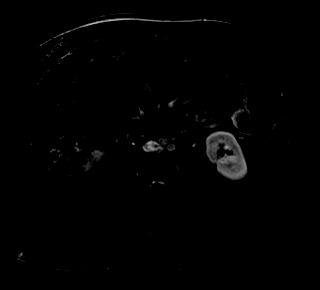
[im 44/88]
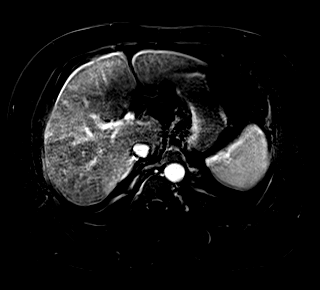
[im 88/88]
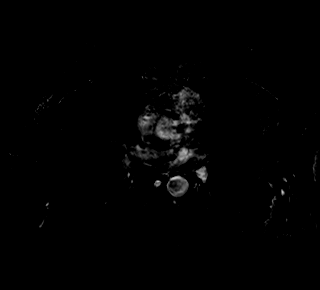

[48 of 48 positions shown; findings below may reference images not displayed]

FINDINGS: Lower chest: No acute findings.

Hepatobiliary: Diffuse hepatic steatosis is demonstrated on chemical
shift imaging. Mild caudate lobe hypertrophy is noted, but no other
gross morphologic changes of cirrhosis are seen. Focal fatty sparing
is seen in the central left lobe adjacent to the portal vein, which
accounts for the lesion seen on recent ultrasound. A sub-cm cyst
seen in the posterior right hepatic lobe. No liver masses are
identified. Gallbladder is unremarkable. No evidence of biliary
ductal dilatation.

Pancreas:  No mass or inflammatory changes.

Spleen: Mild splenomegaly with length measuring 13-14 cm. No splenic
masses identified.

Adrenals/Urinary Tract: No masses identified. No evidence of
hydronephrosis.

Stomach/Bowel: Visualized portion unremarkable.

Vascular/Lymphatic: No pathologically enlarged lymph nodes
identified. No abdominal aortic aneurysm.

Other:  None.

Musculoskeletal:  No suspicious bone lesions identified.
IMPRESSION: Diffuse hepatic steatosis, with focal fatty sparing in the central
left lobe which accounts for the lesion seen on recent ultrasound.
No evidence of hepatic mass.

Mild caudate lobe hypertrophy, which can be seen with early/mild
cirrhosis. Consider correlation with liver function tests and
serology.

Mild splenomegaly.

## 2021-09-05 ENCOUNTER — Emergency Department (HOSPITAL_BASED_OUTPATIENT_CLINIC_OR_DEPARTMENT_OTHER)
Admission: EM | Admit: 2021-09-05 | Discharge: 2021-09-05 | Disposition: A | Discharge status: Home / Self Care | Payer: Self-pay | Attending: Emergency Medicine | Admitting: Emergency Medicine

## 2021-09-05 ENCOUNTER — Other Ambulatory Visit: Payer: Self-pay

## 2021-09-05 DIAGNOSIS — H9202 Otalgia, left ear: Secondary | ICD-10-CM

## 2021-09-05 DIAGNOSIS — T85698A Other mechanical complication of other specified internal prosthetic devices, implants and grafts, initial encounter: Secondary | ICD-10-CM

## 2021-09-05 DIAGNOSIS — H6122 Impacted cerumen, left ear: Secondary | ICD-10-CM | POA: Insufficient documentation

## 2021-09-05 MED ORDER — AMOXICILLIN 500 MG PO CAPS: 500.0000 mg | ORAL_CAPSULE | Freq: Three times a day (TID) | ORAL | 0 refills | Status: AC

## 2021-09-05 MED ORDER — IBUPROFEN 800 MG PO TABS
800.0000 mg | ORAL_TABLET | Freq: Once | ORAL | Status: AC
  Administered 2021-09-05: 800 mg via ORAL
  Filled 2021-09-05: qty 1

## 2021-09-05 MED ORDER — AMOXICILLIN 500 MG PO CAPS
500.0000 mg | ORAL_CAPSULE | Freq: Once | ORAL | Status: AC
  Administered 2021-09-05: 500 mg via ORAL
  Filled 2021-09-05: qty 1

## 2021-09-05 MED ORDER — CIPROFLOXACIN-DEXAMETHASONE 0.3-0.1 % OT SUSP: 4.0000 [drp] | Freq: Three times a day (TID) | OTIC | 0 refills | Status: AC

## 2021-09-05 NOTE — ED Notes (Signed)
Ears irrigated with moderate amount of wax removed. ?

## 2021-09-05 NOTE — ED Triage Notes (Signed)
Pt c/o left sided ear pain, tubes in ears placed approx 1 yr ago, has not been able to f/u with ent due to insurance issues. Alert and oriented, amb to triage ?

## 2021-09-05 NOTE — ED Provider Notes (Signed)
?[facility] ?Provider Note ? ? ?CSN: [id number] ?Arrival date & time: 09/05/21  0454 ? ?  ? ?History ? ?Chief Complaint  ?Patient presents with  ? Otalgia  ?  Left sided ear pain started a couple days ago, hx of tube placement in ears  ? ? ?Eddie Lane is a 51 y.o. male. ? ?The history is provided by the patient.  ?Otalgia ?Location:  Left ?Behind ear:  No abnormality ?Quality:  Aching ?Severity:  Moderate ?Onset quality:  Gradual ?Duration: hours. ?Timing:  Constant ?Progression:  Worsening ?Chronicity:  New ?Context: not recent URI and not water in ear   ?Relieved by:  Nothing ?Worsened by:  Nothing ?Ineffective treatments:  None tried ?Associated symptoms: no cough, no fever, no sore throat and no tinnitus   ?Risk factors: no recent travel   ?Patient with uncontrolled DM and B Tympanostomy tubes for several years for serous otitis and hearing loss presents with left ear pain this evening.  No f/c/r.  No fever or neck pain.  ?  ? ?Home Medications ?Prior to Admission medications   ?Medication Sig Start Date End Date Taking? Authorizing Provider  ?ciprofloxacin-dexamethasone (CIPRODEX) OTIC suspension Place 4 drops into the left ear in the morning, at noon, and at bedtime. 09/05/21  Yes Nan Maya, MD  ?albuterol (PROVENTIL HFA;VENTOLIN HFA) 108 (90 BASE) MCG/ACT inhaler Inhale 2 puffs into the lungs every 6 (six) hours as needed for wheezing or shortness of breath.    [provider]  ?aspirin 81 MG tablet Take 81 mg by mouth daily.    [provider]  ?escitalopram (LEXAPRO) 20 MG tablet Take 20 mg by mouth daily.     [provider]  ?guaifenesin (ROBITUSSIN) 100 MG/5ML syrup Take 5 mLs (100 mg total) by mouth 4 (four) times daily as needed for cough. 12/26/17   McDonald, Mia A, PA-C  ?Insulin Detemir (LEVEMIR FLEXTOUCH) 100 UNIT/ML Pen Inject 12 Units into the skin daily. 03/12/15   Reather LittlerKumar, Ajay, MD  ?Insulin Pen Needle 31G X 5 MM MISC Use one per day to  inject insulin. 03/12/15   Reather LittlerKumar, Ajay, MD  ?metFORMIN (GLUCOPHAGE) 1000 MG tablet Take 1,000 mg by mouth 2 (two) times daily. 12/14/17   [provider]  ?omeprazole (PRILOSEC) 20 MG capsule Take 20 mg by mouth daily.  01/06/15   [provider]  ?QVAR 40 MCG/ACT inhaler Inhale 1 puff into the lungs daily as needed (sob and wheezing).  01/11/15   [provider]  ?   ? ?Allergies    ?Patient has no known allergies.   ? ?Review of Systems   ?Review of Systems  ?Constitutional:  Negative for fever.  ?HENT:  Positive for ear pain. Negative for facial swelling, sore throat and tinnitus.   ?Eyes:  Negative for redness.  ?Respiratory:  Negative for cough.   ?Cardiovascular:  Negative for chest pain.  ?Neurological:  Negative for facial asymmetry.  ? ?Physical Exam ?Updated Vital Signs ?BP (!) 155/97   Pulse 90   Temp 99.1 ?F (37.3 ?C) (Oral)   Resp 17   Ht 6' (1.829 m)   Wt 111.1 kg   SpO2 98%   BMI 33.23 kg/m?  ?Physical Exam ?Vitals and nursing note reviewed. Exam conducted with a chaperone present.  ?Constitutional:   ?   General: He is not in acute distress. ?   Appearance: Normal appearance.  ?HENT:  ?   Head: Normocephalic and atraumatic.  ?   Right  Ear: Tympanic membrane normal.  ?   Ears:  ?   Comments: R T tube in normal position in the R TM, left canal impacted with cerumen,  no swelling or displacement of either auricle, no pain with motion of the auricle or the tragus.  No pain over either mastoid.  No LAN of the neck or preauricular. Post flush of the the T canal the T tube is in the canal and not in the tympanic membrane.  No tear or opening in the TM ?   Nose: Nose normal.  ?Eyes:  ?   Conjunctiva/sclera: Conjunctivae normal.  ?   Pupils: Pupils are equal, round, and reactive to light.  ?Cardiovascular:  ?   Rate and Rhythm: Normal rate and regular rhythm.  ?   Pulses: Normal pulses.  ?   Heart sounds: Normal heart sounds.  ?Pulmonary:  ?   Effort: Pulmonary effort is normal.   ?   Breath sounds: Normal breath sounds.  ?Abdominal:  ?   General: Bowel sounds are normal.  ?   Palpations: Abdomen is soft.  ?   Tenderness: There is no abdominal tenderness. There is no guarding.  ?Musculoskeletal:     ?   General: Normal range of motion.  ?   Cervical back: Normal range of motion and neck supple. No rigidity.  ?Lymphadenopathy:  ?   Cervical: No cervical adenopathy.  ?Skin: ?   General: Skin is warm and dry.  ?   Capillary Refill: Capillary refill takes less than 2 seconds.  ?Neurological:  ?   General: No focal deficit present.  ?   Mental Status: He is alert and oriented to person, place, and time.  ?   Deep Tendon Reflexes: Reflexes normal.  ?Psychiatric:     ?   Mood and Affect: Mood normal.     ?   Behavior: Behavior normal.  ? ? ?ED Results / Procedures / Treatments   ?Labs ?(all labs ordered are listed, but only abnormal results are displayed) ?Labs Reviewed - No data to display ? ?EKG ?None ? ?Radiology ?No results found. ? ?Procedures ?Procedures  ? ? ?Medications Ordered in ED ?Medications  ?ibuprofen (ADVIL) tablet 800 mg (has no administration in time range)  ? ? ?ED Course/ Medical Decision Making/ A&P ?  ?                        ?Medical Decision Making ?Left ear pain that started tonight, has had ear tunes in for several years because of chronic serous otitis and hearing loss but has not followed up with ENT ? ?Amount and/or Complexity of Data Reviewed ?Independent Historian: spouse ?   Details: see above ?External Data Reviewed: notes. ?   Details: previous ENT notes reviewed ? ?Risk ?Prescription drug management. ?Risk Details: There are no signs of mastoiditis on exam.  I do not seen overt AOM or otitis externa but given patient has uncontrolled DM with ear pain and the T tube is out on the left I will cover with both oral antibiotics (amoxicillin) as well as ear drops with ciprodex.  I have advised close follow up with ENT to decide whether the T tube should be replaced and  for reevaluation.  Patient and wife verbalize understanding and agree to follow up.   ? ? ? ?Final Clinical Impression(s) / ED Diagnoses ?Final diagnoses:  ?None  ? ?Return for intractable cough, coughing up blood, fevers > 100.4 unrelieved by  medication, shortness of breath, intractable vomiting, chest pain, shortness of breath, weakness, numbness, changes in speech, facial asymmetry, abdominal pain, passing out, Inability to tolerate liquids or food, cough, altered mental status or any concerns. No signs of systemic illness or infection. The patient is nontoxic-appearing on exam and vital signs are within normal limits.  ?I have reviewed the triage vital signs and the nursing notes. Pertinent labs & imaging results that were available during my care of the patient were reviewed by me and considered in my medical decision making (see chart for details). After history, exam, and medical workup I feel the patient has been appropriately medically screened and is safe for discharge home. Pertinent diagnoses were discussed with the patient. Patient was given return precautions.  ? ?  ?Gia Lusher, MD ?09/05/21 0532 ? ?

## 2021-10-23 ENCOUNTER — Other Ambulatory Visit: Payer: Self-pay

## 2021-10-23 ENCOUNTER — Emergency Department (HOSPITAL_BASED_OUTPATIENT_CLINIC_OR_DEPARTMENT_OTHER)
Admission: EM | Admit: 2021-10-23 | Discharge: 2021-10-23 | Disposition: A | Discharge status: Home / Self Care | Payer: No Typology Code available for payment source | Attending: Emergency Medicine | Admitting: Emergency Medicine

## 2021-10-23 ENCOUNTER — Encounter (HOSPITAL_BASED_OUTPATIENT_CLINIC_OR_DEPARTMENT_OTHER): Payer: Self-pay | Admitting: Emergency Medicine

## 2021-10-23 DIAGNOSIS — K625 Hemorrhage of anus and rectum: Secondary | ICD-10-CM | POA: Diagnosis not present

## 2021-10-23 DIAGNOSIS — E119 Type 2 diabetes mellitus without complications: Secondary | ICD-10-CM | POA: Insufficient documentation

## 2021-10-23 DIAGNOSIS — Z794 Long term (current) use of insulin: Secondary | ICD-10-CM | POA: Diagnosis not present

## 2021-10-23 DIAGNOSIS — Z7982 Long term (current) use of aspirin: Secondary | ICD-10-CM | POA: Diagnosis not present

## 2021-10-23 DIAGNOSIS — Z7984 Long term (current) use of oral hypoglycemic drugs: Secondary | ICD-10-CM | POA: Insufficient documentation

## 2021-10-23 HISTORY — DX: Bipolar disorder, unspecified: F31.9

## 2021-10-23 HISTORY — DX: Unspecified cirrhosis of liver: K74.60

## 2021-10-23 LAB — CBC WITH DIFFERENTIAL/PLATELET
Abs Immature Granulocytes: 0.01 10*3/uL (ref 0.00–0.07)
Basophils Absolute: 0 10*3/uL (ref 0.0–0.1)
Basophils Relative: 1 %
Eosinophils Absolute: 0 10*3/uL (ref 0.0–0.5)
Eosinophils Relative: 1 %
HCT: 44.7 % (ref 39.0–52.0)
Hemoglobin: 14.7 g/dL (ref 13.0–17.0)
Immature Granulocytes: 0 %
Lymphocytes Relative: 42 %
Lymphs Abs: 1.9 10*3/uL (ref 0.7–4.0)
MCH: 29.1 pg (ref 26.0–34.0)
MCHC: 32.9 g/dL (ref 30.0–36.0)
MCV: 88.3 fL (ref 80.0–100.0)
Monocytes Absolute: 0.3 10*3/uL (ref 0.1–1.0)
Monocytes Relative: 6 %
Neutro Abs: 2.3 10*3/uL (ref 1.7–7.7)
Neutrophils Relative %: 50 %
Platelets: 122 10*3/uL — ABNORMAL LOW (ref 150–400)
RBC: 5.06 MIL/uL (ref 4.22–5.81)
RDW: 13.2 % (ref 11.5–15.5)
WBC: 4.5 10*3/uL (ref 4.0–10.5)
nRBC: 0 % (ref 0.0–0.2)

## 2021-10-23 LAB — BASIC METABOLIC PANEL
Anion gap: 11 (ref 5–15)
BUN: 14 mg/dL (ref 6–20)
CO2: 19 mmol/L — ABNORMAL LOW (ref 22–32)
Calcium: 9.7 mg/dL (ref 8.9–10.3)
Chloride: 103 mmol/L (ref 98–111)
Creatinine, Ser: 0.9 mg/dL (ref 0.61–1.24)
GFR, Estimated: >60 mL/min (ref >60)
Glucose, Bld: 332 mg/dL — ABNORMAL HIGH (ref 70–99)
Potassium: 4.6 mmol/L (ref 3.5–5.1)
Sodium: 133 mmol/L — ABNORMAL LOW (ref 135–145)

## 2021-10-23 LAB — PROTIME-INR
INR: 1.3 — ABNORMAL HIGH (ref 0.8–1.2)
Prothrombin Time: 16 seconds — ABNORMAL HIGH (ref 11.4–15.2)

## 2021-10-23 LAB — OCCULT BLOOD X 1 CARD TO LAB, STOOL: Fecal Occult Bld: POSITIVE — AB

## 2021-10-23 NOTE — ED Notes (Signed)
Patient given discharge instructions, all questions answered. Patient in possession of all belongings, directed to the discharge area  

## 2021-10-23 NOTE — ED Triage Notes (Signed)
Pt reports he has cirrhosis and his numbers have been up, and has ultrasound scheduled.

## 2021-10-23 NOTE — ED Triage Notes (Signed)
Pt awoke this am ,went to bathroom and "straight blood, bright red" coming out, has happened 3 times this am. Denies illness.no bloodthinners, pt 's metformin was changed 2 days ago, only change.

## 2021-10-23 NOTE — ED Provider Notes (Signed)
[facility] DEPT Provider Note   CSN: [id number] Arrival date & time: 10/23/21  1039     History  Chief Complaint  Patient presents with   Blood In Stools    Eddie Lane is a 51 y.o. male.  Presented to the emergency room due to concern for bright red blood per rectum.  Patient reports that he had an episode of bright red blood this morning, then noted some slightly darker red in his stool.  He otherwise has been feeling fine.  He denies any rectal pain.  No abdominal pain.  No nausea or vomiting.  Patient reports he may have cirrhosis.  Also endorses history of diabetes.  Last A1c per review of chart is 6.2.   Additional history was obtained via chart review, review of [facility] lab work, labs on 6/5 demonstrate slight elevation in alk phos, AST and ALT.  Patient states that he has a remote history of drinking but denies history of alcoholism.   HPI     Home Medications Prior to Admission medications   Medication Sig Start Date End Date Taking? Authorizing Provider  albuterol (PROVENTIL HFA;VENTOLIN HFA) 108 (90 BASE) MCG/ACT inhaler Inhale 2 puffs into the lungs every 6 (six) hours as needed for wheezing or shortness of breath.    [provider]  amoxicillin (AMOXIL) 500 MG capsule Take 1 capsule (500 mg total) by mouth 3 (three) times daily. 09/05/21   Palumbo, April, MD  aspirin 81 MG tablet Take 81 mg by mouth daily.    [provider]  ciprofloxacin-dexamethasone (CIPRODEX) OTIC suspension Place 4 drops into the left ear in the morning, at noon, and at bedtime. 09/05/21   Palumbo, April, MD  escitalopram (LEXAPRO) 20 MG tablet Take 20 mg by mouth daily.     [provider]  guaifenesin (ROBITUSSIN) 100 MG/5ML syrup Take 5 mLs (100 mg total) by mouth 4 (four) times daily as needed for cough. 12/26/17   McDonald, Mia A, PA-C  Insulin Detemir (LEVEMIR FLEXTOUCH) 100 UNIT/ML Pen Inject 12 Units into the skin daily.  03/12/15   Elayne Snare, MD  Insulin Pen Needle 31G X 5 MM MISC Use one per day to inject insulin. 03/12/15   Elayne Snare, MD  metFORMIN (GLUCOPHAGE) 1000 MG tablet Take 1,000 mg by mouth 2 (two) times daily. 12/14/17   [provider]  omeprazole (PRILOSEC) 20 MG capsule Take 20 mg by mouth daily.  01/06/15   [provider]  QVAR 40 MCG/ACT inhaler Inhale 1 puff into the lungs daily as needed (sob and wheezing).  01/11/15   [provider]      Allergies    Patient has no known allergies.    Review of Systems   Review of Systems  Constitutional:  Negative for chills and fever.  HENT:  Negative for ear pain and sore throat.   Eyes:  Negative for pain and visual disturbance.  Respiratory:  Negative for cough and shortness of breath.   Cardiovascular:  Negative for chest pain and palpitations.  Gastrointestinal:  Positive for blood in stool. Negative for abdominal pain and vomiting.  Genitourinary:  Negative for dysuria and hematuria.  Musculoskeletal:  Negative for arthralgias and back pain.  Skin:  Negative for color change and rash.  Neurological:  Negative for seizures and syncope.  All other systems reviewed and are negative.   Physical Exam Updated Vital Signs BP 113/87   Pulse 73   Temp 98.7 F (37.1  C) (Oral)   Resp 16   SpO2 97%  Physical Exam Vitals and nursing note reviewed.  Constitutional:      General: He is not in acute distress.    Appearance: He is well-developed.  HENT:     Head: Normocephalic and atraumatic.  Eyes:     Conjunctiva/sclera: Conjunctivae normal.  Cardiovascular:     Rate and Rhythm: Normal rate and regular rhythm.     Heart sounds: No murmur heard. Pulmonary:     Effort: Pulmonary effort is normal. No respiratory distress.     Breath sounds: Normal breath sounds.  Abdominal:     Palpations: Abdomen is soft.     Tenderness: There is no abdominal tenderness.  Genitourinary:    Comments: Small blood noted around  rectum, stool is brown, no external hemorrhoids or fissures noted, Tim Ship broker Musculoskeletal:        General: No swelling.     Cervical back: Neck supple.  Skin:    General: Skin is warm and dry.     Capillary Refill: Capillary refill takes less than 2 seconds.  Neurological:     General: No focal deficit present.     Mental Status: He is alert.  Psychiatric:        Mood and Affect: Mood normal.     ED Results / Procedures / Treatments   Labs (all labs ordered are listed, but only abnormal results are displayed) Labs Reviewed  CBC WITH DIFFERENTIAL/PLATELET - Abnormal; Notable for the following components:      Result Value   Platelets 122 (*)    All other components within normal limits  BASIC METABOLIC PANEL - Abnormal; Notable for the following components:   Sodium 133 (*)    CO2 19 (*)    Glucose, Bld 332 (*)    All other components within normal limits  OCCULT BLOOD X 1 CARD TO LAB, STOOL - Abnormal; Notable for the following components:   Fecal Occult Bld POSITIVE (*)    All other components within normal limits  PROTIME-INR - Abnormal; Notable for the following components:   Prothrombin Time 16.0 (*)    INR 1.3 (*)    All other components within normal limits    EKG None  Radiology No results found.  Procedures Procedures    Medications Ordered in ED Medications - No data to display  ED Course/ Medical Decision Making/ A&P                           Medical Decision Making Amount and/or Complexity of Data Reviewed Labs: ordered.   51 year old male presents to ER for eval of bright red blood per rectum.  On exam well-appearing, soft abdomen, no ongoing medical complaints otherwise.  Normal vitals.  Noted small blood around rectum but stool on rectal exam was brown.  Hemoccult positive.  His hemoglobin is normal.  Patient reported possible history of cirrhosis though he was unsure of any specific details.  Checked INR, this just above the upper  limit of normal.  Given patient well appearance, stable hemoglobin, stable vital signs, no associated symptoms, feel he can be discharged and managed in the outpatient setting.  Strongly advised following up with gastroenterology and his primary care doctor regarding this episode.  Reviewed return precautions both with patient and his significant other at bedside.  After the discussed management above, the patient was determined to be safe for discharge.  The patient was  in agreement with this plan and all questions regarding their care were answered.  ED return precautions were discussed and the patient will return to the ED with any significant worsening of condition.         Final Clinical Impression(s) / ED Diagnoses Final diagnoses:  Bright red blood per rectum    Rx / DC Orders ED Discharge Orders     None         Lucrezia Starch, MD 10/23/21 1347

## 2021-10-23 NOTE — Discharge Instructions (Signed)
Follow-up with gastroenterology regarding the episode of blood in stool today.  If you are having worsening blood in stool, any lightheadedness, passing out, shortness of breath or other new concerning symptom, return to ER for reassessment.

## 2022-04-01 DIAGNOSIS — T162XXA Foreign body in left ear, initial encounter: Secondary | ICD-10-CM | POA: Diagnosis not present

## 2022-04-01 DIAGNOSIS — Z9622 Myringotomy tube(s) status: Secondary | ICD-10-CM | POA: Diagnosis not present

## 2022-04-01 DIAGNOSIS — H65492 Other chronic nonsuppurative otitis media, left ear: Secondary | ICD-10-CM | POA: Diagnosis not present

## 2022-04-01 DIAGNOSIS — H9012 Conductive hearing loss, unilateral, left ear, with unrestricted hearing on the contralateral side: Secondary | ICD-10-CM | POA: Diagnosis not present

## 2023-02-02 ENCOUNTER — Encounter (HOSPITAL_BASED_OUTPATIENT_CLINIC_OR_DEPARTMENT_OTHER): Payer: Self-pay | Admitting: Emergency Medicine

## 2023-02-02 ENCOUNTER — Emergency Department (HOSPITAL_BASED_OUTPATIENT_CLINIC_OR_DEPARTMENT_OTHER)
Admission: EM | Admit: 2023-02-02 | Discharge: 2023-02-02 | Disposition: A | Discharge status: Home / Self Care | Payer: BLUE CROSS/BLUE SHIELD | Attending: Emergency Medicine | Admitting: Emergency Medicine

## 2023-02-02 ENCOUNTER — Other Ambulatory Visit: Payer: Self-pay

## 2023-02-02 DIAGNOSIS — Z794 Long term (current) use of insulin: Secondary | ICD-10-CM | POA: Insufficient documentation

## 2023-02-02 DIAGNOSIS — R195 Other fecal abnormalities: Secondary | ICD-10-CM

## 2023-02-02 DIAGNOSIS — R739 Hyperglycemia, unspecified: Secondary | ICD-10-CM

## 2023-02-02 DIAGNOSIS — Z7951 Long term (current) use of inhaled steroids: Secondary | ICD-10-CM | POA: Diagnosis not present

## 2023-02-02 DIAGNOSIS — K921 Melena: Secondary | ICD-10-CM | POA: Diagnosis present

## 2023-02-02 DIAGNOSIS — E1165 Type 2 diabetes mellitus with hyperglycemia: Secondary | ICD-10-CM | POA: Diagnosis not present

## 2023-02-02 DIAGNOSIS — J45909 Unspecified asthma, uncomplicated: Secondary | ICD-10-CM | POA: Insufficient documentation

## 2023-02-02 LAB — CBC WITH DIFFERENTIAL/PLATELET
Abs Immature Granulocytes: 0 10*3/uL (ref 0.00–0.07)
Basophils Absolute: 0.1 10*3/uL (ref 0.0–0.1)
Basophils Relative: 1 %
Eosinophils Absolute: 0 10*3/uL (ref 0.0–0.5)
Eosinophils Relative: 1 %
HCT: 41.5 % (ref 39.0–52.0)
Hemoglobin: 14.1 g/dL (ref 13.0–17.0)
Immature Granulocytes: 0 %
Lymphocytes Relative: 45 %
Lymphs Abs: 2.3 10*3/uL (ref 0.7–4.0)
MCH: 30.3 pg (ref 26.0–34.0)
MCHC: 34 g/dL (ref 30.0–36.0)
MCV: 89.1 fL (ref 80.0–100.0)
Monocytes Absolute: 0.4 10*3/uL (ref 0.1–1.0)
Monocytes Relative: 8 %
Neutro Abs: 2.2 10*3/uL (ref 1.7–7.7)
Neutrophils Relative %: 45 %
Platelets: 144 10*3/uL — ABNORMAL LOW (ref 150–400)
RBC: 4.66 MIL/uL (ref 4.22–5.81)
RDW: 12.9 % (ref 11.5–15.5)
WBC: 4.9 10*3/uL (ref 4.0–10.5)
nRBC: 0 % (ref 0.0–0.2)

## 2023-02-02 LAB — COMPREHENSIVE METABOLIC PANEL
ALT: 39 U/L (ref 0–44)
AST: 34 U/L (ref 15–41)
Albumin: 3.5 g/dL (ref 3.5–5.0)
Alkaline Phosphatase: 102 U/L (ref 38–126)
Anion gap: 6 (ref 5–15)
BUN: 8 mg/dL (ref 6–20)
CO2: 27 mmol/L (ref 22–32)
Calcium: 8.6 mg/dL — ABNORMAL LOW (ref 8.9–10.3)
Chloride: 101 mmol/L (ref 98–111)
Creatinine, Ser: 0.95 mg/dL (ref 0.61–1.24)
GFR, Estimated: >60 mL/min (ref >60)
Glucose, Bld: 361 mg/dL — ABNORMAL HIGH (ref 70–99)
Potassium: 3.9 mmol/L (ref 3.5–5.1)
Sodium: 134 mmol/L — ABNORMAL LOW (ref 135–145)
Total Bilirubin: 0.9 mg/dL (ref 0.3–1.2)
Total Protein: 7.4 g/dL (ref 6.5–8.1)

## 2023-02-02 LAB — PROTIME-INR
INR: 1.1 (ref 0.8–1.2)
Prothrombin Time: 14.5 s (ref 11.4–15.2)

## 2023-02-02 LAB — OCCULT BLOOD X 1 CARD TO LAB, STOOL: Fecal Occult Bld: NEGATIVE

## 2023-02-02 MED ORDER — SODIUM CHLORIDE 0.9 % IV SOLN: INTRAVENOUS | Status: DC

## 2023-02-02 MED ORDER — PANTOPRAZOLE 80MG IVPB - SIMPLE MED
80.0000 mg | Freq: Once | INTRAVENOUS | Status: DC
  Filled 2023-02-02: qty 100

## 2023-02-02 MED ORDER — SODIUM CHLORIDE 0.9 % IV BOLUS
1000.0000 mL | Freq: Once | INTRAVENOUS | Status: AC
  Administered 2023-02-02: 1000 mL via INTRAVENOUS

## 2023-02-02 MED ORDER — PANTOPRAZOLE INFUSION (NEW) - SIMPLE MED
8.0000 mg/h | INTRAVENOUS | Status: DC
  Filled 2023-02-02: qty 100

## 2023-02-02 NOTE — ED Provider Notes (Signed)
[place] EMERGENCY DEPARTMENT AT [facility] Provider Note   CSN: [id number] Arrival date & time: 02/02/23  1214     History  No chief complaint on file.   Eddie Lane is a 52 y.o. male.  HPI   52 year old male with medical history significant for DM2, GERD, asthma, HLD, bipolar disorder, cirrhosis who presents to the emergency department with an episode of dark tarry stool that started earlier this week.  The patient states that he is not on anticoagulation.  He is not on iron supplementation.  He denies any recent Pepto-Bismol use.  He denies any abdominal pain, nausea, vomiting.  He states that actually the stools that he has been having have changed back to normal in character over the past few days and are now normal brown.  He denies any shortness of breath, fatigue, weakness.  He was post to follow-up outpatient with GI but has not yet done this.  Home Medications Prior to Admission medications   Medication Sig Start Date End Date Taking? Authorizing Provider  albuterol (PROVENTIL HFA;VENTOLIN HFA) 108 (90 BASE) MCG/ACT inhaler Inhale 2 puffs into the lungs every 6 (six) hours as needed for wheezing or shortness of breath.    [provider]  amoxicillin (AMOXIL) 500 MG capsule Take 1 capsule (500 mg total) by mouth 3 (three) times daily. 09/05/21   Palumbo, April, MD  aspirin 81 MG tablet Take 81 mg by mouth daily.    [provider]  ciprofloxacin-dexamethasone (CIPRODEX) OTIC suspension Place 4 drops into the left ear in the morning, at noon, and at bedtime. 09/05/21   Palumbo, April, MD  escitalopram (LEXAPRO) 20 MG tablet Take 20 mg by mouth daily.     [provider]  guaifenesin (ROBITUSSIN) 100 MG/5ML syrup Take 5 mLs (100 mg total) by mouth 4 (four) times daily as needed for cough. 12/26/17   McDonald, Mia A, PA-C  Insulin Detemir (LEVEMIR FLEXTOUCH) 100 UNIT/ML Pen Inject 12 Units into the skin daily. 03/12/15   Reather Littler, MD  Insulin  Pen Needle 31G X 5 MM MISC Use one per day to inject insulin. 03/12/15   Reather Littler, MD  metFORMIN (GLUCOPHAGE) 1000 MG tablet Take 1,000 mg by mouth 2 (two) times daily. 12/14/17   [provider]  omeprazole (PRILOSEC) 20 MG capsule Take 20 mg by mouth daily.  01/06/15   [provider]  QVAR 40 MCG/ACT inhaler Inhale 1 puff into the lungs daily as needed (sob and wheezing).  01/11/15   [provider]      Allergies    Patient has no known allergies.    Review of Systems   Review of Systems  All other systems reviewed and are negative.   Physical Exam Updated Vital Signs BP 114/74   Pulse 80   Temp 98.8 F (37.1 C) (Oral)   Resp 18   SpO2 96%  Physical Exam Vitals and nursing note reviewed. Exam conducted with a chaperone present.  Constitutional:      General: He is not in acute distress.    Appearance: He is well-developed.  HENT:     Head: Normocephalic and atraumatic.  Eyes:     Conjunctiva/sclera: Conjunctivae normal.  Cardiovascular:     Rate and Rhythm: Normal rate and regular rhythm.  Pulmonary:     Effort: Pulmonary effort is normal. No respiratory distress.     Breath sounds: Normal breath sounds.  Abdominal:     Palpations: Abdomen is soft.  Tenderness: There is no abdominal tenderness.  Genitourinary:    Rectum: Guaiac result negative.     Comments: No melena or hematochezia, no hemorrhoids, no anal fissure, fecal occult negative Musculoskeletal:        General: No swelling.     Cervical back: Neck supple.  Skin:    General: Skin is warm and dry.     Capillary Refill: Capillary refill takes less than 2 seconds.  Neurological:     Mental Status: He is alert.  Psychiatric:        Mood and Affect: Mood normal.     ED Results / Procedures / Treatments   Labs (all labs ordered are listed, but only abnormal results are displayed) Labs Reviewed  COMPREHENSIVE METABOLIC PANEL - Abnormal; Notable for the following  components:      Result Value   Sodium 134 (*)    Glucose, Bld 361 (*)    Calcium 8.6 (*)    All other components within normal limits  CBC WITH DIFFERENTIAL/PLATELET - Abnormal; Notable for the following components:   Platelets 144 (*)    All other components within normal limits  OCCULT BLOOD X 1 CARD TO LAB, STOOL  PROTIME-INR    EKG None  Radiology No results found.  Procedures Procedures    Medications Ordered in ED Medications  sodium chloride 0.9 % bolus 1,000 mL (0 mLs Intravenous Stopped 02/02/23 1446)    ED Course/ Medical Decision Making/ A&P                                 Medical Decision Making Amount and/or Complexity of Data Reviewed Labs: ordered.  Risk Prescription drug management.    52 year old male with medical history significant for DM2, GERD, asthma, HLD, bipolar disorder, cirrhosis who presents to the emergency department with an episode of dark tarry stool that started earlier this week.  The patient states that he is not on anticoagulation.  He is not on iron supplementation.  He denies any recent Pepto-Bismol use.  He denies any abdominal pain, nausea, vomiting.  He states that actually the stools that he has been having have changed back to normal in character over the past few days and are now normal brown.  He denies any shortness of breath, fatigue, weakness.  He was post to follow-up outpatient with GI but has not yet done this.  On arrival, the patient was vitally stable, afebrile, not tachycardic or tachypneic, hemodynamically stable, saturating well on room air.  Sinus rhythm noted on cardiac telemetry.  Patient presenting with reported episode of dark stools however stools have now returned to normal in character.  No evidence of GI bleed on exam.  Laboratory evaluation performed to include fecal occult blood testing which was negative, CBC without a leukocytosis, no anemia with a hemoglobin of 14.1, INR normal, CMP with hyperglycemia to  361 but otherwise unremarkable.  The patient was administered NaCl bolus with his hyperglycemia.  Currently the patient is asymptomatic, no evidence of acute GI bleed, no indication for admission at this time.  I provided the patient with return precautions and recommended that he follow-up outpatient with gastroenterology and his PCP as needed.  DC Instructions: Your stool occult blood testing was negative and you had no evidence of acute bright blood or dark sticky blood on exam concerning for an acute gastrointestinal bleed.  Your laboratory evaluation was also reassuring with a normal hemoglobin.  If you did have a GI bleed, it appears to have resolved and your hemoglobin is normal at this time.  Your blood glucose was mildly high for which we have given you some fluids.  Recommend that you schedule follow-up with a gastroenterologist for consideration for a colonoscopy.   Final Clinical Impression(s) / ED Diagnoses Final diagnoses:  Dark stools  Hyperglycemia    Rx / DC Orders ED Discharge Orders          Ordered    Ambulatory referral to Gastroenterology        02/02/23 1439              Ernie Avena, MD 02/03/23 2340

## 2023-02-02 NOTE — ED Triage Notes (Signed)
Tarry, black stool started this week. No iron, no blood thinner Denies pain Diarrhea prior to noticing dark stool, missed appt with GI per patient

## 2023-02-02 NOTE — ED Notes (Signed)
Reviewed AVS/discharge instruction with patient. Time allotted for and all questions answered. Patient is agreeable for d/c and escorted to ed exit by staff.  

## 2023-02-02 NOTE — Discharge Instructions (Signed)
Your stool occult blood testing was negative and you had no evidence of acute bright blood or dark sticky blood on exam concerning for an acute gastrointestinal bleed.  Your laboratory evaluation was also reassuring with a normal hemoglobin.  If you did have a GI bleed, it appears to have resolved and your hemoglobin is normal at this time.  Your blood glucose was mildly high for which we have given you some fluids.  Recommend that you schedule follow-up with a gastroenterologist for consideration for a colonoscopy.

## 2024-01-31 DIAGNOSIS — H9312 Tinnitus, left ear: Secondary | ICD-10-CM | POA: Diagnosis not present

## 2024-01-31 DIAGNOSIS — E1165 Type 2 diabetes mellitus with hyperglycemia: Secondary | ICD-10-CM | POA: Diagnosis not present

## 2024-02-04 DIAGNOSIS — R197 Diarrhea, unspecified: Secondary | ICD-10-CM | POA: Diagnosis not present

## 2024-03-16 DIAGNOSIS — E1165 Type 2 diabetes mellitus with hyperglycemia: Secondary | ICD-10-CM | POA: Diagnosis not present

## 2024-03-16 DIAGNOSIS — R197 Diarrhea, unspecified: Secondary | ICD-10-CM | POA: Diagnosis not present
